# Patient Record
Sex: Female | Born: 1985 | Race: Black or African American | Hispanic: No | Marital: Married | Smoking: Former smoker
Health system: Southern US, Community
[De-identification: ages and names within clinical notes are randomized; demographics above are authoritative.]

## PROBLEM LIST (undated history)

## (undated) DIAGNOSIS — D649 Anemia, unspecified: Secondary | ICD-10-CM

## (undated) DIAGNOSIS — Z8489 Family history of other specified conditions: Secondary | ICD-10-CM

## (undated) DIAGNOSIS — R569 Unspecified convulsions: Secondary | ICD-10-CM

## (undated) DIAGNOSIS — O139 Gestational [pregnancy-induced] hypertension without significant proteinuria, unspecified trimester: Secondary | ICD-10-CM

## (undated) DIAGNOSIS — R51 Headache: Secondary | ICD-10-CM

## (undated) HISTORY — PX: IUD REMOVAL: SHX5392

## (undated) HISTORY — PX: WISDOM TOOTH EXTRACTION: SHX21

## (undated) HISTORY — PX: ORIF WRIST FRACTURE: SHX2133

## (undated) HISTORY — PX: DILATION AND CURETTAGE OF UTERUS: SHX78

## (undated) HISTORY — PX: DIAGNOSTIC LAPAROSCOPY: SUR761

---
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
## 2005-03-24 ENCOUNTER — Emergency Department: Payer: Self-pay | Admitting: Emergency Medicine

## 2005-06-03 ENCOUNTER — Other Ambulatory Visit: Payer: Self-pay

## 2005-06-04 ENCOUNTER — Observation Stay: Payer: Self-pay | Admitting: Pediatrics

## 2006-07-29 ENCOUNTER — Emergency Department: Payer: Self-pay | Admitting: Emergency Medicine

## 2007-05-12 ENCOUNTER — Inpatient Hospital Stay: Payer: Self-pay

## 2008-09-03 IMAGING — CR DG CHEST 2V
1 series · 2 of 2 positions shown · non-contrast
Comparison: none

REASON FOR EXAM: pneumonia
COMMENTS:

[Series 1: view not recorded · 0.17mm/px · 2 of 2 slices shown]
[im 1/2]
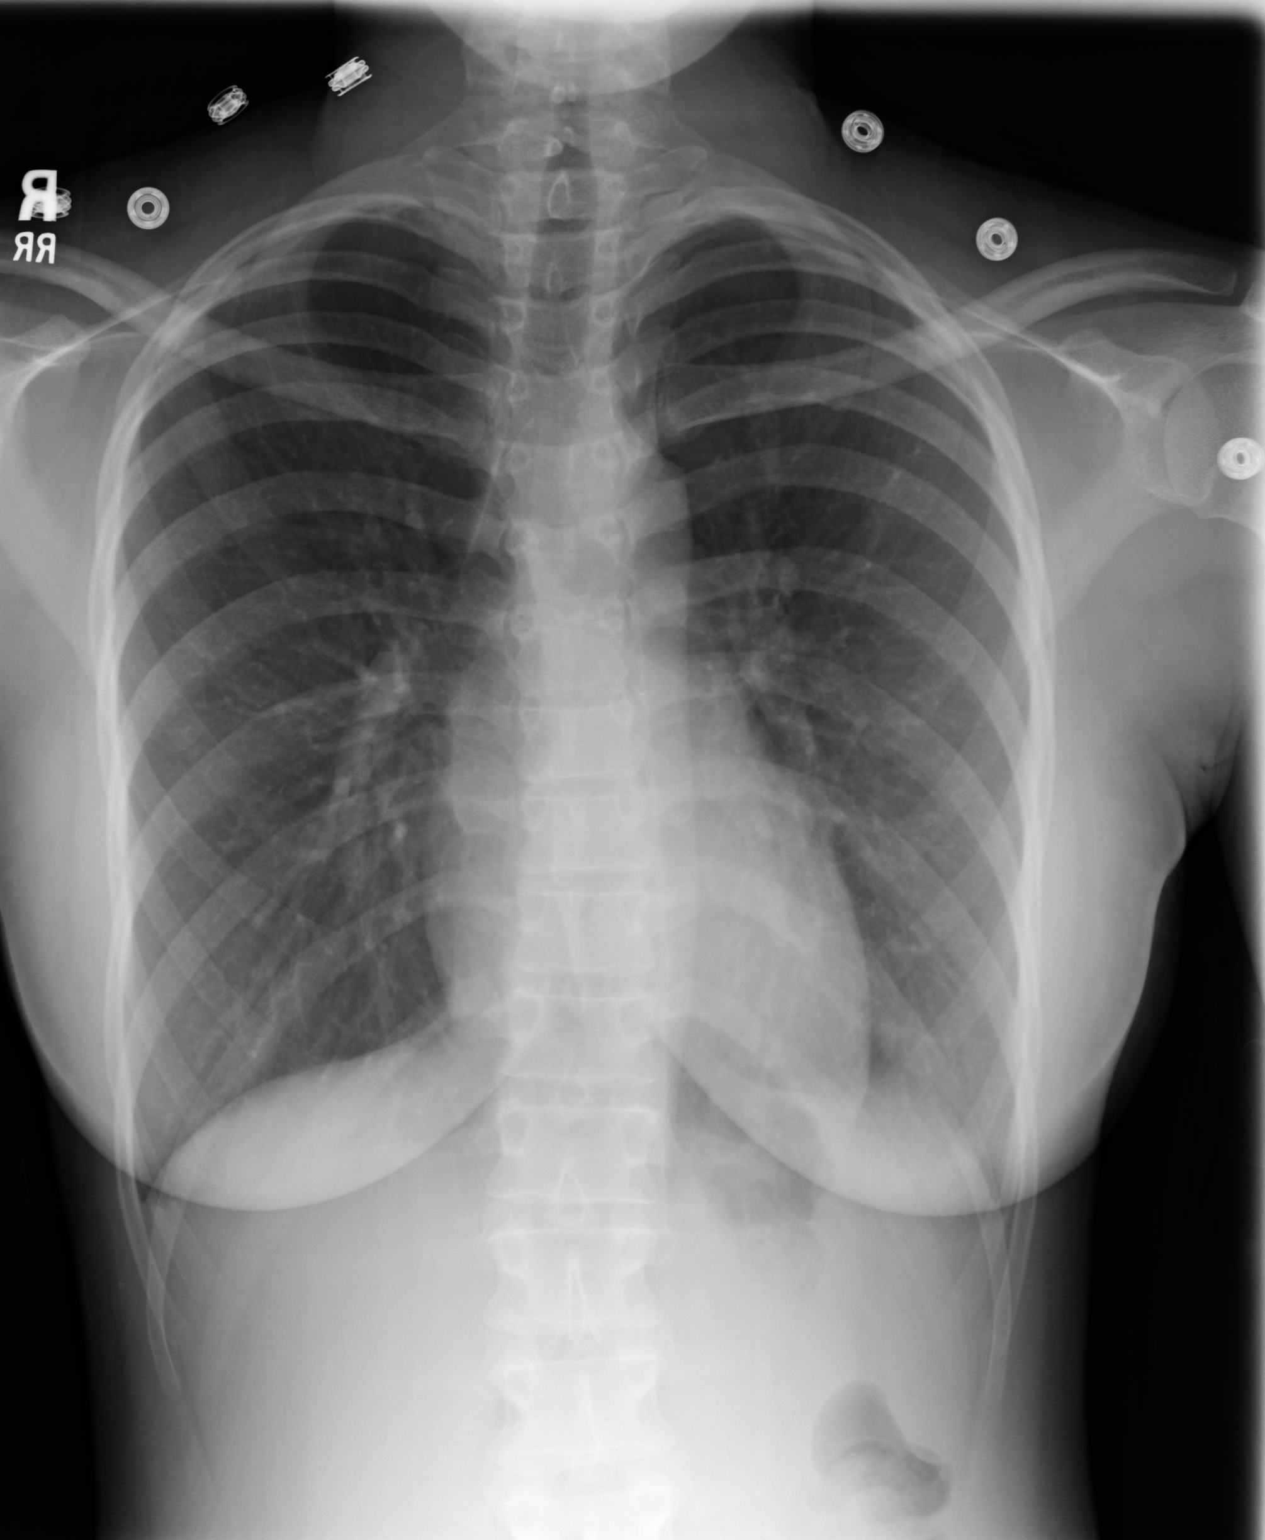
[im 2/2]
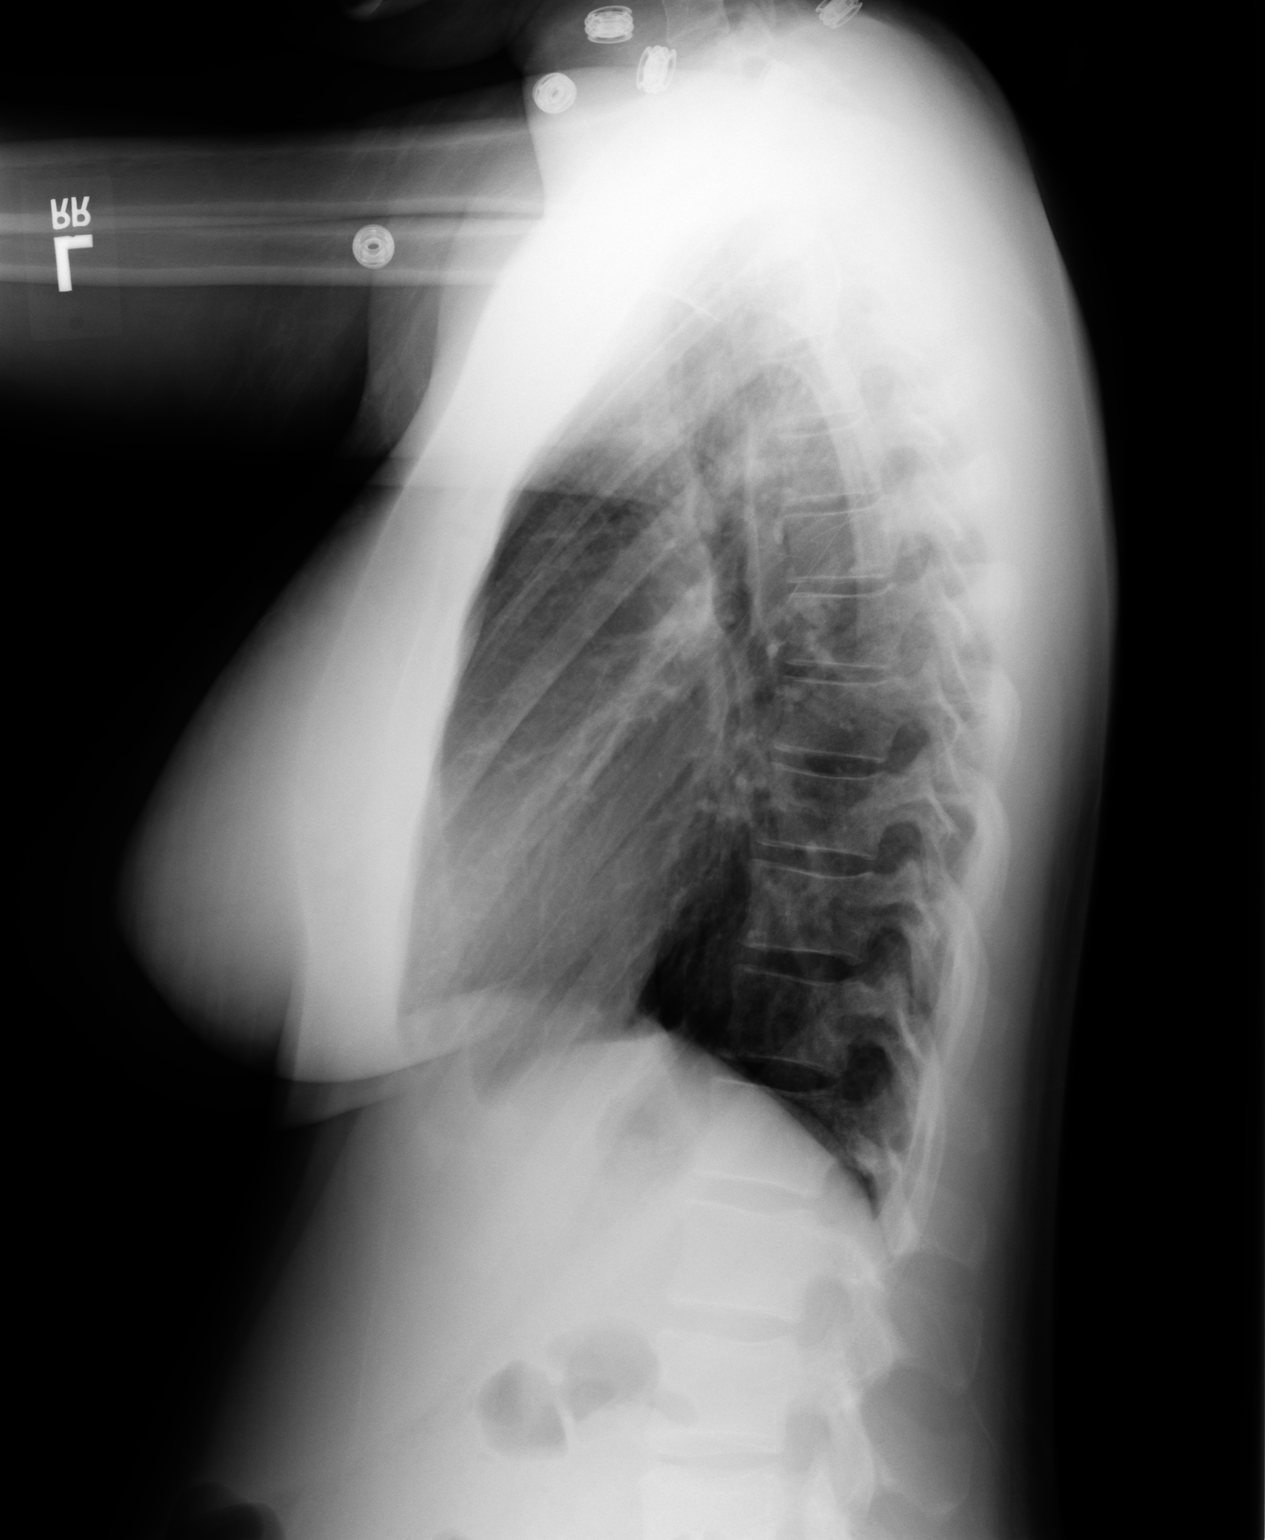

[2 of 2 positions shown; findings below may reference images not displayed]

PROCEDURE:     DXR - DXR CHEST PA (OR AP) AND LATERAL  - May 18, 2007  [DATE]

RESULT:     Comparison is made to the study dated 05/15/2007. There is
improved aeration at the lung bases. The cardiac silhouette appears to be
normal. No definite infiltrate or effusion is demonstrated. The LEFT heart
border is better visualized consistent with improving aeration in the
lingula. There is no pneumothorax. The bony structures appear intact.
IMPRESSION: 1.     Improved aeration in the lingula with no definite infiltrate evident
on today's exam.

## 2009-05-12 ENCOUNTER — Ambulatory Visit: Payer: Self-pay | Admitting: Obstetrics & Gynecology

## 2009-05-19 ENCOUNTER — Ambulatory Visit: Payer: Self-pay | Admitting: Obstetrics & Gynecology

## 2009-11-15 ENCOUNTER — Ambulatory Visit: Payer: Self-pay | Admitting: Obstetrics & Gynecology

## 2009-11-22 ENCOUNTER — Ambulatory Visit: Payer: Self-pay | Admitting: Obstetrics & Gynecology

## 2010-07-16 ENCOUNTER — Emergency Department (HOSPITAL_COMMUNITY)
Admission: EM | Admit: 2010-07-16 | Discharge: 2010-07-16 | Payer: Self-pay | Source: Home / Self Care | Admitting: Emergency Medicine

## 2010-07-17 LAB — RAPID STREP SCREEN (MED CTR MEBANE ONLY): Streptococcus, Group A Screen (Direct): NEGATIVE

## 2010-07-19 LAB — STREP A DNA PROBE: Group A Strep Probe: NEGATIVE

## 2011-02-17 ENCOUNTER — Emergency Department (HOSPITAL_COMMUNITY)
Admission: EM | Admit: 2011-02-17 | Discharge: 2011-02-17 | Disposition: A | Discharge status: Home / Self Care | Payer: Self-pay | Attending: Emergency Medicine | Admitting: Emergency Medicine

## 2011-02-17 ENCOUNTER — Emergency Department (HOSPITAL_COMMUNITY): Payer: Self-pay

## 2011-02-17 DIAGNOSIS — R05 Cough: Secondary | ICD-10-CM | POA: Insufficient documentation

## 2011-02-17 DIAGNOSIS — IMO0002 Reserved for concepts with insufficient information to code with codable children: Secondary | ICD-10-CM | POA: Insufficient documentation

## 2011-02-17 DIAGNOSIS — R059 Cough, unspecified: Secondary | ICD-10-CM | POA: Insufficient documentation

## 2011-02-17 DIAGNOSIS — R6889 Other general symptoms and signs: Secondary | ICD-10-CM | POA: Insufficient documentation

## 2011-08-17 ENCOUNTER — Encounter (HOSPITAL_COMMUNITY): Payer: Self-pay | Admitting: *Deleted

## 2011-08-17 ENCOUNTER — Emergency Department (HOSPITAL_COMMUNITY): Payer: No Typology Code available for payment source

## 2011-08-17 ENCOUNTER — Emergency Department (HOSPITAL_COMMUNITY)
Admission: EM | Admit: 2011-08-17 | Discharge: 2011-08-17 | Disposition: A | Discharge status: Home / Self Care | Payer: No Typology Code available for payment source | Attending: Emergency Medicine | Admitting: Emergency Medicine

## 2011-08-17 DIAGNOSIS — M545 Low back pain, unspecified: Secondary | ICD-10-CM | POA: Insufficient documentation

## 2011-08-17 DIAGNOSIS — S139XXA Sprain of joints and ligaments of unspecified parts of neck, initial encounter: Secondary | ICD-10-CM | POA: Insufficient documentation

## 2011-08-17 DIAGNOSIS — S335XXA Sprain of ligaments of lumbar spine, initial encounter: Secondary | ICD-10-CM | POA: Insufficient documentation

## 2011-08-17 DIAGNOSIS — M542 Cervicalgia: Secondary | ICD-10-CM | POA: Insufficient documentation

## 2011-08-17 DIAGNOSIS — S39012A Strain of muscle, fascia and tendon of lower back, initial encounter: Secondary | ICD-10-CM

## 2011-08-17 DIAGNOSIS — R1033 Periumbilical pain: Secondary | ICD-10-CM | POA: Insufficient documentation

## 2011-08-17 DIAGNOSIS — S161XXA Strain of muscle, fascia and tendon at neck level, initial encounter: Secondary | ICD-10-CM

## 2011-08-17 DIAGNOSIS — R209 Unspecified disturbances of skin sensation: Secondary | ICD-10-CM | POA: Insufficient documentation

## 2011-08-17 MED ORDER — CYCLOBENZAPRINE HCL 5 MG PO TABS: 5.0000 mg | ORAL_TABLET | Freq: Three times a day (TID) | ORAL | Status: AC | PRN

## 2011-08-17 MED ORDER — IBUPROFEN 600 MG PO TABS: 600.0000 mg | ORAL_TABLET | Freq: Four times a day (QID) | ORAL | Status: AC | PRN

## 2011-08-17 MED ORDER — OXYCODONE-ACETAMINOPHEN 5-325 MG PO TABS
1.0000 | ORAL_TABLET | Freq: Once | ORAL | Status: AC
  Administered 2011-08-17: 1 via ORAL
  Filled 2011-08-17: qty 1

## 2011-08-17 MED ORDER — HYDROCODONE-ACETAMINOPHEN 5-500 MG PO TABS: 1.0000 | ORAL_TABLET | Freq: Three times a day (TID) | ORAL | Status: AC | PRN

## 2011-08-17 NOTE — ED Notes (Signed)
Family at bedside.  Pt resting.  States she has neck and low back pain still, not as bad and that the tingling to her legs aren't as bad.

## 2011-08-17 NOTE — Discharge Instructions (Signed)
Back Exercises Back exercises help treat and prevent back injuries. The goal is to increase your strength in your belly (abdominal) and back muscles. These exercises can also help with flexibility. Start these exercises when told by your doctor. HOME CARE Back exercises include: Pelvic Tilt.  Lie on your back with your knees bent. Tilt your pelvis until the lower part of your back is against the floor. Hold this position 5 to 10 sec. Repeat this exercise 5 to 10 times.  Knee to Chest.  Pull 1 knee up against your chest and hold for 20 to 30 seconds. Repeat this with the other knee. This may be done with the other leg straight or bent, whichever feels better. Then, pull both knees up against your chest.  Sit-Ups or Curl-Ups.  Bend your knees 90 degrees. Start with tilting your pelvis, and do a partial, slow sit-up. Only lift your upper half 30 to 45 degrees off the floor. Take at least 2 to 3 seonds for each sit-up. Do not do sit-ups with your knees out straight. If partial sit-ups are difficult, simply do the above but with only tightening your belly (abdominal) muscles and holding it as told.  Hip-Lift.  Lie on your back with your knees flexed 90 degrees. Push down with your feet and shoulders as you raise your hips 2 inches off the floor. Hold for 10 seconds, repeat 5 to 10 times.  Back Arches.  Lie on your stomach. Prop yourself up on bent elbows. Slowly press on your hands, causing an arch in your low back. Repeat 3 to 5 times.  Shoulder-Lifts.  Lie face down with arms beside your body. Keep hips and belly pressed to floor as you slowly lift your head and shoulders off the floor.  Do not overdo your exercises. Be careful in the beginning. Exercises may cause you some mild back discomfort. If the pain lasts for more than 15 minutes, stop the exercises until you see your doctor. Improvement with exercise for back problems is slow.  Document Released: 07/21/2010 Document Revised: 02/28/2011  Document Reviewed: 07/21/2010 [facility] Patient Information 2012 [place], [place]. Your x-rays are normal.  Please take the medication as prescribed.  Follow up with Dr. Philipp Deputy, orthopedics if you still having pain after 7-10 days
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
## 2011-08-18 NOTE — ED Provider Notes (Signed)
Medical screening examination/treatment/procedure(s) were performed by non-physician practitioner and as supervising physician I was immediately available for consultation/collaboration.   Celene Kras, MD 08/18/11 [phone]

## 2012-01-23 ENCOUNTER — Emergency Department (HOSPITAL_COMMUNITY): Payer: No Typology Code available for payment source

## 2012-01-23 ENCOUNTER — Encounter (HOSPITAL_COMMUNITY): Payer: Self-pay | Admitting: Emergency Medicine

## 2012-01-23 ENCOUNTER — Emergency Department (HOSPITAL_COMMUNITY)
Admission: EM | Admit: 2012-01-23 | Discharge: 2012-01-23 | Disposition: A | Discharge status: Home / Self Care | Payer: No Typology Code available for payment source | Attending: Emergency Medicine | Admitting: Emergency Medicine

## 2012-01-23 DIAGNOSIS — R0602 Shortness of breath: Secondary | ICD-10-CM | POA: Insufficient documentation

## 2012-01-23 DIAGNOSIS — Y92410 Unspecified street and highway as the place of occurrence of the external cause: Secondary | ICD-10-CM | POA: Insufficient documentation

## 2012-01-23 DIAGNOSIS — M542 Cervicalgia: Secondary | ICD-10-CM | POA: Insufficient documentation

## 2012-01-23 DIAGNOSIS — S62101A Fracture of unspecified carpal bone, right wrist, initial encounter for closed fracture: Secondary | ICD-10-CM

## 2012-01-23 DIAGNOSIS — S62109A Fracture of unspecified carpal bone, unspecified wrist, initial encounter for closed fracture: Secondary | ICD-10-CM | POA: Insufficient documentation

## 2012-01-23 MED ORDER — ONDANSETRON HCL 4 MG/2ML IJ SOLN
4.0000 mg | Freq: Once | INTRAMUSCULAR | Status: AC
  Administered 2012-01-23: 4 mg via INTRAVENOUS
  Filled 2012-01-23: qty 2

## 2012-01-23 MED ORDER — OXYCODONE-ACETAMINOPHEN 5-325 MG PO TABS
1.0000 | ORAL_TABLET | Freq: Once | ORAL | Status: AC
  Administered 2012-01-23: 1 via ORAL
  Filled 2012-01-23: qty 1

## 2012-01-23 MED ORDER — OXYCODONE-ACETAMINOPHEN 5-325 MG PO TABS: 1.0000 | ORAL_TABLET | ORAL | Status: AC | PRN

## 2012-01-23 MED ORDER — MORPHINE SULFATE 4 MG/ML IJ SOLN: 4.0000 mg | Freq: Once | INTRAMUSCULAR | Status: DC

## 2012-01-23 MED ORDER — MORPHINE SULFATE 4 MG/ML IJ SOLN
4.0000 mg | Freq: Once | INTRAMUSCULAR | Status: AC
  Administered 2012-01-23: 4 mg via INTRAVENOUS
  Filled 2012-01-23: qty 1

## 2012-01-23 MED ORDER — DIPHENHYDRAMINE HCL 50 MG/ML IJ SOLN: 25.0000 mg | Freq: Once | INTRAMUSCULAR | Status: DC

## 2012-01-23 NOTE — Progress Notes (Signed)
Orthopedic Tech Progress Note Patient Details:  Taylor Hood 1985/12/16 [id number]  Ortho Devices Type of Ortho Device: Arm foam sling;Sugartong splint Ortho Device/Splint Location: (R) UE Ortho Device/Splint Interventions: Application   Jennye Moccasin 01/23/2012, 6:52 PM
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
## 2012-01-25 ENCOUNTER — Encounter (HOSPITAL_COMMUNITY): Payer: Self-pay

## 2012-01-25 ENCOUNTER — Other Ambulatory Visit: Payer: Self-pay | Admitting: Orthopedic Surgery

## 2012-01-29 ENCOUNTER — Encounter (HOSPITAL_COMMUNITY)
Admission: RE | Admit: 2012-01-29 | Discharge: 2012-01-29 | Disposition: A | Discharge status: Home / Self Care | Payer: No Typology Code available for payment source | Source: Ambulatory Visit | Attending: Orthopedic Surgery | Admitting: Orthopedic Surgery

## 2012-01-29 ENCOUNTER — Encounter (HOSPITAL_COMMUNITY): Payer: Self-pay

## 2012-01-29 HISTORY — DX: Unspecified convulsions: R56.9

## 2012-01-29 HISTORY — DX: Family history of other specified conditions: Z84.89

## 2012-01-29 LAB — CBC
HCT: 34.5 % — ABNORMAL LOW (ref 36.0–46.0)
Hemoglobin: 12.1 g/dL (ref 12.0–15.0)
MCH: 31.3 pg (ref 26.0–34.0)
MCHC: 35.1 g/dL (ref 30.0–36.0)
MCV: 89.4 fL (ref 78.0–100.0)
Platelets: 243 10*3/uL (ref 150–400)
RBC: 3.86 MIL/uL — ABNORMAL LOW (ref 3.87–5.11)
RDW: 12.6 % (ref 11.5–15.5)
WBC: 6.3 10*3/uL (ref 4.0–10.5)

## 2012-01-29 LAB — SURGICAL PCR SCREEN
MRSA, PCR: NEGATIVE
Staphylococcus aureus: NEGATIVE

## 2012-01-29 LAB — HCG, SERUM, QUALITATIVE: Preg, Serum: NEGATIVE

## 2012-01-29 MED ORDER — CHLORHEXIDINE GLUCONATE 4 % EX LIQD: 60.0000 mL | Freq: Once | CUTANEOUS | Status: DC

## 2012-01-29 MED ORDER — CEFAZOLIN SODIUM-DEXTROSE 2-3 GM-% IV SOLR
2.0000 g | INTRAVENOUS | Status: DC
  Filled 2012-01-29: qty 50

## 2012-01-29 NOTE — Pre-Procedure Instructions (Signed)
[address]  01/29/2012   Your procedure is scheduled on:  Wednesday January 30, 2012  Report to [facility] at 12:30PM.  Call this number if you have problems the morning of surgery: [phone]   Remember:   Do not eat food or drink :After Midnight.    Take these medicines the morning of surgery with A SIP OF WATER: percocet   Do not wear jewelry, make-up or nail polish.  Do not wear lotions, powders, or perfumes. You may wear deodorant.  Do not shave 48 hours prior to surgery. Men may shave face and neck.  Do not bring valuables to the hospital.  Contacts, dentures or bridgework may not be worn into surgery.  Leave suitcase in the car. After surgery it may be brought to your room.  For patients admitted to the hospital, checkout time is 11:00 AM the day of discharge.   Patients discharged the day of surgery will not be allowed to drive home.  Name and phone number of your driver: family / friend  Special Instructions: Incentive Spirometry - Practice and bring it with you on the day of surgery. and CHG Shower Use Special Wash: 1/2 bottle night before surgery and 1/2 bottle morning of surgery.   Please read over the following fact sheets that you were given: Pain Booklet, Coughing and Deep Breathing, MRSA Information and Surgical Site Infection Prevention

## 2012-01-30 ENCOUNTER — Ambulatory Visit (HOSPITAL_COMMUNITY): Payer: No Typology Code available for payment source | Admitting: Anesthesiology

## 2012-01-30 ENCOUNTER — Encounter (HOSPITAL_COMMUNITY): Payer: Self-pay | Admitting: *Deleted

## 2012-01-30 ENCOUNTER — Encounter (HOSPITAL_COMMUNITY): Payer: Self-pay | Admitting: Anesthesiology

## 2012-01-30 ENCOUNTER — Ambulatory Visit (HOSPITAL_COMMUNITY)
Admission: RE | Admit: 2012-01-30 | Discharge: 2012-01-30 | Disposition: A | Discharge status: Home / Self Care | Payer: No Typology Code available for payment source | Source: Ambulatory Visit | Attending: Orthopedic Surgery | Admitting: Orthopedic Surgery

## 2012-01-30 ENCOUNTER — Encounter (HOSPITAL_COMMUNITY)
Admission: RE | Disposition: A | Discharge status: Home / Self Care | Payer: Self-pay | Source: Ambulatory Visit | Attending: Orthopedic Surgery

## 2012-01-30 DIAGNOSIS — S5290XA Unspecified fracture of unspecified forearm, initial encounter for closed fracture: Secondary | ICD-10-CM

## 2012-01-30 DIAGNOSIS — S52509A Unspecified fracture of the lower end of unspecified radius, initial encounter for closed fracture: Secondary | ICD-10-CM | POA: Insufficient documentation

## 2012-01-30 DIAGNOSIS — Z01812 Encounter for preprocedural laboratory examination: Secondary | ICD-10-CM | POA: Insufficient documentation

## 2012-01-30 DIAGNOSIS — X58XXXA Exposure to other specified factors, initial encounter: Secondary | ICD-10-CM | POA: Insufficient documentation

## 2012-01-30 HISTORY — PX: CARPAL TUNNEL RELEASE: SHX101

## 2012-01-30 SURGERY — OPEN REDUCTION INTERNAL FIXATION (ORIF) DISTAL RADIUS FRACTURE
Anesthesia: General | Site: Arm Lower | Laterality: Right | Wound class: Clean

## 2012-01-30 MED ORDER — ROPIVACAINE HCL 5 MG/ML IJ SOLN
INTRAMUSCULAR | Status: DC | PRN
  Administered 2012-01-30: 30 mL via EPIDURAL

## 2012-01-30 MED ORDER — BUPIVACAINE HCL (PF) 0.25 % IJ SOLN
INTRAMUSCULAR | Status: DC | PRN
  Administered 2012-01-30: 8 mL

## 2012-01-30 MED ORDER — FENTANYL CITRATE 0.05 MG/ML IJ SOLN
INTRAMUSCULAR | Status: DC | PRN
  Administered 2012-01-30 (×2): 100 ug via INTRAVENOUS

## 2012-01-30 MED ORDER — PROPOFOL 10 MG/ML IV EMUL
INTRAVENOUS | Status: DC | PRN
  Administered 2012-01-30: 120 mg via INTRAVENOUS

## 2012-01-30 MED ORDER — ONDANSETRON HCL 4 MG/2ML IJ SOLN: 4.0000 mg | Freq: Once | INTRAMUSCULAR | Status: DC | PRN

## 2012-01-30 MED ORDER — NEOSTIGMINE METHYLSULFATE 1 MG/ML IJ SOLN
INTRAMUSCULAR | Status: DC | PRN
  Administered 2012-01-30: 4 mg via INTRAVENOUS
  Administered 2012-01-30: 1 mg via INTRAVENOUS

## 2012-01-30 MED ORDER — MIDAZOLAM HCL 5 MG/5ML IJ SOLN
INTRAMUSCULAR | Status: DC | PRN
  Administered 2012-01-30: 2 mg via INTRAVENOUS

## 2012-01-30 MED ORDER — ROCURONIUM BROMIDE 100 MG/10ML IV SOLN
INTRAVENOUS | Status: DC | PRN
  Administered 2012-01-30: 50 mg via INTRAVENOUS

## 2012-01-30 MED ORDER — HYDROMORPHONE HCL PF 1 MG/ML IJ SOLN: 0.2500 mg | INTRAMUSCULAR | Status: DC | PRN

## 2012-01-30 MED ORDER — CEFAZOLIN SODIUM-DEXTROSE 2-3 GM-% IV SOLR
INTRAVENOUS | Status: AC
  Filled 2012-01-30: qty 50

## 2012-01-30 MED ORDER — HYDROMORPHONE HCL 2 MG PO TABS: 2.0000 mg | ORAL_TABLET | ORAL | Status: AC | PRN

## 2012-01-30 MED ORDER — BUPIVACAINE HCL (PF) 0.25 % IJ SOLN
INTRAMUSCULAR | Status: AC
  Filled 2012-01-30: qty 30

## 2012-01-30 MED ORDER — LACTATED RINGERS IV SOLN
INTRAVENOUS | Status: DC | PRN
  Administered 2012-01-30 (×2): via INTRAVENOUS

## 2012-01-30 MED ORDER — LIDOCAINE HCL (CARDIAC) 20 MG/ML IV SOLN
INTRAVENOUS | Status: DC | PRN
  Administered 2012-01-30: 100 mg via INTRAVENOUS

## 2012-01-30 MED ORDER — GLYCOPYRROLATE 0.2 MG/ML IJ SOLN
INTRAMUSCULAR | Status: DC | PRN
  Administered 2012-01-30: 0.6 mg via INTRAVENOUS
  Administered 2012-01-30: 0.2 mg via INTRAVENOUS

## 2012-01-30 MED ORDER — LACTATED RINGERS IV SOLN
INTRAVENOUS | Status: DC
  Administered 2012-01-30: 14:00:00 via INTRAVENOUS

## 2012-01-30 MED ORDER — DEXAMETHASONE SODIUM PHOSPHATE 4 MG/ML IJ SOLN
INTRAMUSCULAR | Status: DC | PRN
  Administered 2012-01-30: 4 mg via INTRAVENOUS

## 2012-01-30 MED ORDER — ONDANSETRON HCL 4 MG/2ML IJ SOLN
INTRAMUSCULAR | Status: DC | PRN
  Administered 2012-01-30: 4 mg via INTRAVENOUS

## 2012-01-30 MED ORDER — CEFAZOLIN SODIUM 1-5 GM-% IV SOLN
INTRAVENOUS | Status: DC | PRN
  Administered 2012-01-30: 2 g via INTRAVENOUS

## 2012-01-30 SURGICAL SUPPLY — 46 items
BANDAGE ELASTIC 3 VELCRO ST LF (GAUZE/BANDAGES/DRESSINGS) ×2 IMPLANT
BANDAGE GAUZE ELAST BULKY 4 IN (GAUZE/BANDAGES/DRESSINGS) ×2 IMPLANT
BIT DRILL 2 FAST STEP (BIT) ×2 IMPLANT
BIT DRILL 2.5X4 QC (BIT) ×2 IMPLANT
BNDG CMPR 9X4 STRL LF SNTH (GAUZE/BANDAGES/DRESSINGS) ×1
BNDG ESMARK 4X9 LF (GAUZE/BANDAGES/DRESSINGS) ×2 IMPLANT
CLOTH BEACON ORANGE TIMEOUT ST (SAFETY) ×2 IMPLANT
CORDS BIPOLAR (ELECTRODE) ×2 IMPLANT
COVER MAYO STAND STRL (DRAPES) IMPLANT
COVER SURGICAL LIGHT HANDLE (MISCELLANEOUS) ×2 IMPLANT
CUFF TOURNIQUET SINGLE 18IN (TOURNIQUET CUFF) ×2 IMPLANT
CUFF TOURNIQUET SINGLE 24IN (TOURNIQUET CUFF) IMPLANT
DRAPE C-ARM MINI 42X72 WSTRAPS (DRAPES) ×2 IMPLANT
DRAPE SURG 17X23 STRL (DRAPES) ×2 IMPLANT
DURAPREP 26ML APPLICATOR (WOUND CARE) ×2 IMPLANT
GAUZE XEROFORM 1X8 LF (GAUZE/BANDAGES/DRESSINGS) ×2 IMPLANT
GLOVE BIO SURGEON STRL SZ8.5 (GLOVE) ×2 IMPLANT
GOWN PREVENTION PLUS XXLARGE (GOWN DISPOSABLE) ×2 IMPLANT
GOWN SRG XL XLNG 56XLVL 4 (GOWN DISPOSABLE) ×1 IMPLANT
GOWN STRL NON-REIN LRG LVL3 (GOWN DISPOSABLE) ×2 IMPLANT
GOWN STRL NON-REIN XL XLG LVL4 (GOWN DISPOSABLE) ×2
KIT BASIN OR (CUSTOM PROCEDURE TRAY) ×2 IMPLANT
KIT ROOM TURNOVER OR (KITS) ×2 IMPLANT
NEEDLE HYPO 25GX1X1/2 BEV (NEEDLE) IMPLANT
NS IRRIG 1000ML POUR BTL (IV SOLUTION) ×2 IMPLANT
PACK ORTHO EXTREMITY (CUSTOM PROCEDURE TRAY) ×2 IMPLANT
PAD ARMBOARD 7.5X6 YLW CONV (MISCELLANEOUS) ×4 IMPLANT
PAD CAST 4YDX4 CTTN HI CHSV (CAST SUPPLIES) ×1 IMPLANT
PADDING CAST COTTON 4X4 STRL (CAST SUPPLIES) ×2
PEG FULLY THREADED 2.5X22MM (Peg) ×12 IMPLANT
PLATE SHORT 24.4X51.3 RT (Plate) ×2 IMPLANT
SCREW PEG LOCK 2.5X24 (Peg) ×2 IMPLANT
SPONGE GAUZE 4X4 12PLY (GAUZE/BANDAGES/DRESSINGS) ×2 IMPLANT
STRIP CLOSURE SKIN 1/2X4 (GAUZE/BANDAGES/DRESSINGS) ×2 IMPLANT
SUT ETHILON 4 0 PS 2 18 (SUTURE) IMPLANT
SUT PROLENE 3 0 PS 2 (SUTURE) IMPLANT
SUT VIC AB 2-0 SH 27 (SUTURE) ×2
SUT VIC AB 2-0 SH 27XBRD (SUTURE) ×1 IMPLANT
SUT VIC AB 3-0 PS2 18 (SUTURE)
SUT VIC AB 3-0 PS2 18XBRD (SUTURE) IMPLANT
SUT VICRYL RAPIDE 4/0 PS 2 (SUTURE) ×2 IMPLANT
SYR CONTROL 10ML LL (SYRINGE) IMPLANT
TOWEL OR 17X24 6PK STRL BLUE (TOWEL DISPOSABLE) ×2 IMPLANT
TOWEL OR 17X26 10 PK STRL BLUE (TOWEL DISPOSABLE) ×2 IMPLANT
UNDERPAD 30X30 INCONTINENT (UNDERPADS AND DIAPERS) ×2 IMPLANT
WATER STERILE IRR 1000ML POUR (IV SOLUTION) ×2 IMPLANT

## 2012-01-30 NOTE — Discharge Instructions (Signed)
Cast or Splint Care Casts and splints support injured limbs and keep bones from moving while they heal.  HOME CARE  Keep the cast or splint uncovered during the drying period.   A plaster cast can take 24 to 48 hours to dry.   A fiberglass cast will dry in less than 1 hour.   Do not rest the cast on anything harder than a pillow for 24 hours.   Do not put weight on your injured limb. Do not put pressure on the cast. Wait for your doctor's approval.   Keep the cast or splint dry.   Cover the cast or splint with a plastic bag during baths or wet weather.   If you have a cast over your chest and belly (trunk), take sponge baths until the cast is taken off.   Keep your cast or splint clean. Wash a dirty cast with a damp cloth.   Do not put any objects under your cast or splint. Do not scratch the skin under the cast with an object.   Do not take out the padding from inside your cast.   Exercise your joints near the cast as told by your doctor.   Raise (elevate) your injured limb on 1 or 2 pillows for the first 1 to 3 days.  GET HELP RIGHT AWAY IF:  Your cast or splint cracks.   Your cast or splint is too tight or too loose.   You itch badly under the cast.   Your cast gets wet or has a soft spot.   You have a bad smell coming from the cast.   You get an object stuck under the cast.   Your skin around the cast becomes red or raw.   You have new or more pain after the cast is put on.   You have fluid leaking through the cast.   You cannot move your fingers or toes.   Your fingers or toes turn colors or are cool, painful, or puffy (swollen).   You have tingling or lose feeling (numbness) around the injured area.   You have pain or pressure under the cast.   You have trouble breathing or have shortness of breath.   You have chest pain.  MAKE SURE YOU:  Understand these instructions.   Will watch your condition.   Will get help right away if you are not doing  well or get worse.  Document Released: 10/18/2010 Document Revised: 06/07/2011 Document Reviewed: 10/18/2010 [employer] Patient Information 2012 [employer]. 

## 2012-01-30 NOTE — Brief Op Note (Signed)
01/30/2012  4:30 PM  PATIENT:  Taylor Hood  26 y.o. female  PRE-OPERATIVE DIAGNOSIS:  right distal radius fx right distal ulnar fx  POST-OPERATIVE DIAGNOSIS:  right distal radius fracture, right distal ulnar fracture  PROCEDURE:  Procedure(s) (LRB): OPEN REDUCTION INTERNAL FIXATION (ORIF) DISTAL RADIAL FRACTURE (Right) CARPAL TUNNEL RELEASE (Right)  SURGEON:  Surgeon(s) and Role:    * Marlowe Shores, MD - Primary  PHYSICIAN ASSISTANT:   ASSISTANTS: none   ANESTHESIA:   general  EBL:  Total I/O In: 1000 [I.V.:1000] Out: -   BLOOD ADMINISTERED:none  DRAINS: none   LOCAL MEDICATIONS USED:  MARCAINE   10cc  SPECIMEN:  No Specimen  DISPOSITION OF SPECIMEN:  N/A  COUNTS:  YES  TOURNIQUET:   Total Tourniquet Time Documented: Upper Arm (Right) - 39 minutes  DICTATION: .Other Dictation: Dictation Number [id number]  PLAN OF CARE: Discharge to home after PACU  PATIENT DISPOSITION:  PACU - hemodynamically stable.   Delay start of Pharmacological VTE agent (>24hrs) due to surgical blood loss or risk of bleeding: not applicable

## 2012-01-30 NOTE — Anesthesia Postprocedure Evaluation (Signed)
Anesthesia Post Note  Patient: Taylor Hood  Procedure(s) Performed: Procedure(s) (LRB): OPEN REDUCTION INTERNAL FIXATION (ORIF) DISTAL RADIAL FRACTURE (Right) CARPAL TUNNEL RELEASE (Right)  Anesthesia type: general  Patient location: PACU  Post pain: Pain level controlled  Post assessment: Patient's Cardiovascular Status Stable  Last Vitals:  Filed Vitals:   01/30/12 1700  BP: 127/79  Pulse: 67  Temp: 35.7 C  Resp: 14    Post vital signs: Reviewed and stable  Level of consciousness: sedated  Complications: No apparent anesthesia complications

## 2012-01-30 NOTE — Preoperative (Signed)
Beta Blockers   Reason not to administer Beta Blockers:Not Applicable 

## 2012-01-30 NOTE — Progress Notes (Signed)
Orthopedic Tech Progress Note Patient Details:  Taylor Hood 1985/08/22 [id number]  Ortho Devices Type of Ortho Device: Arm foam sling Ortho Device/Splint Location: (R) UE Ortho Device/Splint Interventions: Application   Jennye Moccasin 01/30/2012, 6:36 PM

## 2012-01-30 NOTE — Transfer of Care (Signed)
Immediate Anesthesia Transfer of Care Note  Patient: Taylor Hood  Procedure(s) Performed: Procedure(s) (LRB): OPEN REDUCTION INTERNAL FIXATION (ORIF) DISTAL RADIAL FRACTURE (Right) CARPAL TUNNEL RELEASE (Right)  Patient Location: PACU  Anesthesia Type: GA combined with regional for post-op pain  Level of Consciousness: awake and alert   Airway & Oxygen Therapy: Patient Spontanous Breathing and Patient connected to nasal cannula oxygen  Post-op Assessment: Report given to PACU RN, Post -op Vital signs reviewed and stable and Patient moving all extremities X 4  Post vital signs: Reviewed and stable  Complications: No apparent anesthesia complications
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
## 2012-01-30 NOTE — Anesthesia Procedure Notes (Addendum)
Anesthesia Regional Block:  Supraclavicular block  Pre-Anesthetic Checklist: ,, timeout performed, Correct Patient, Correct Site, Correct Laterality, Correct Procedure, Correct Position, site marked, Risks and benefits discussed,  Surgical consent,  Pre-op evaluation,  At surgeon's request and post-op pain management  Laterality: Right  Prep: chloraprep       Needles:  Injection technique: Single-shot  Needle Type: Echogenic Stimulator Needle     Needle Length: 5cm 5 cm     Additional Needles:  Procedures: ultrasound guided and nerve stimulator Supraclavicular block  Nerve Stimulator or Paresthesia:  Response: 0.4 mA,   Additional Responses:   Narrative:  Start time: 01/30/2012 2:30 PM End time: 01/30/2012 2:50 PM Injection made incrementally with aspirations every 5 mL. Anesthesiologist: Arta Bruce MD  Additional Notes: Monitors applied. Patient sedated. Sterile prep and drape,hand hygiene and sterile gloves were used. Relevant anatomy identified.Needle position confirmed.Local anesthetic injected incrementally after negative aspiration. Local anesthetic spread visualized around nerve(s). Vascular puncture avoided. No complications. Image printed for medical record.The patient tolerated the procedure well.       Supraclavicular block Procedure Name: Intubation Date/Time: 01/30/2012 3:37 PM Performed by: Elon Alas Pre-anesthesia Checklist: Patient identified, Timeout performed, Emergency Drugs available, Suction available and Patient being monitored Patient Re-evaluated:Patient Re-evaluated prior to inductionOxygen Delivery Method: Circle system utilized Preoxygenation: Pre-oxygenation with 100% oxygen Intubation Type: IV induction Ventilation: Mask ventilation without difficulty Laryngoscope Size: Mac and 3 Grade View: Grade I Tube type: Oral Tube size: 7.0 mm Number of attempts: 1 Airway Equipment and Method: Stylet Placement Confirmation: ETT inserted  through vocal cords under direct vision,  positive ETCO2 and breath sounds checked- equal and bilateral Secured at: 21 cm Tube secured with: Tape Dental Injury: Teeth and Oropharynx as per pre-operative assessment

## 2012-01-30 NOTE — Op Note (Signed)
See dictated note [phone]

## 2012-01-30 NOTE — Anesthesia Preprocedure Evaluation (Addendum)
Anesthesia Evaluation  Patient identified by MRN, date of birth, ID band Patient awake    Reviewed: Allergy & Precautions, H&P , NPO status , Patient's Chart, lab work & pertinent test results  History of Anesthesia Complications (+) Family history of anesthesia reaction  Airway Mallampati: I TM Distance: >3 FB Neck ROM: Full    Dental   Pulmonary neg pulmonary ROS,          Cardiovascular negative cardio ROS      Neuro/Psych Seizures -,  Seizure at age 26, no known reason for occurring negative psych ROS   GI/Hepatic negative GI ROS, Neg liver ROS,   Endo/Other  negative endocrine ROS  Renal/GU negative Renal ROS     Musculoskeletal negative musculoskeletal ROS (+)   Abdominal   Peds  Hematology negative hematology ROS (+)   Anesthesia Other Findings   Reproductive/Obstetrics negative OB ROS                         Anesthesia Physical Anesthesia Plan  ASA: II  Anesthesia Plan: General   Post-op Pain Management:    Induction: Intravenous  Airway Management Planned: Oral ETT  Additional Equipment:   Intra-op Plan:   Post-operative Plan: Extubation in OR  Informed Consent: I have reviewed the patients History and Physical, chart, labs and discussed the procedure including the risks, benefits and alternatives for the proposed anesthesia with the patient or authorized representative who has indicated his/her understanding and acceptance.   Dental advisory given  Plan Discussed with: Surgeon and CRNA  Anesthesia Plan Comments:        Anesthesia Quick Evaluation

## 2012-01-31 ENCOUNTER — Encounter (HOSPITAL_COMMUNITY): Payer: Self-pay | Admitting: Orthopedic Surgery

## 2012-01-31 NOTE — Op Note (Signed)
Taylor Hood, Taylor Hood             ACCOUNT NO.:  [id number]  MEDICAL RECORD NO.:  [id number]  LOCATION:  [facility]                         FACILITY:  [facility]  PHYSICIAN:  Artist Pais. Leannah Guse, M.D.DATE OF BIRTH:  August 31, 1985  DATE OF PROCEDURE:  01/30/2012 DATE OF DISCHARGE:  01/30/2012                              OPERATIVE REPORT   PREOPERATIVE DIAGNOSES:  Displaced intra-articular fracture, right distal radius and right ulnar styloid fracture.  POSTOPERATIVE DIAGNOSES:  Displaced intra-articular fracture, right distal radius and right ulnar styloid fracture.  PROCEDURE:  Open reduction and internal fixation as above with DVR plate and screws, carpal tunnel release, closed treatment of ulnar styloid fracture.  SURGEON:  Artist Pais. Mina Marble, M.D.  ASSISTANT:  None.  ANESTHESIA:  Ax block and general.  COMPLICATIONS:  No complications.  DESCRIPTION OF PROCEDURE:  The patient was taken to the operating suite. After induction of adequate IV analgesia and axillary block analgesia, the right upper extremity was prepped and draped in sterile fashion. Esmarch was used to exsanguinate the limb.  Tourniquet was then inflated to 250 mmHg.  At this point in time, a standard volar approach to the distal radius was undertaken.  Skin was incised sharply over the palpable border of the flexor carpi radialis tendon.  Skin was incised 6- 8 cm.  The FCR sheath was incised.  The FCR was tracked in the midline, radial artery in the lateral side.  This was developed down the level of pronator quadratus.  Carefully subperiosteally stripped off the distal radius revealing intra-articular fracture of 3-4 part.  The subperiosteal section including release of the brachioradialis off the styloid fragment.  After this was completed, a reduction was performed using longitudinal traction, flexion ulnar deviation.  This was done with several times until adequate reduction was obtained.  After this was  completed, a DVR plate was fastened to the volar aspect of the distal radius.  DVR anatomic short plate was placed in the slotted hole. Fluoroscopy was then used to determine adequate position.  After this was completed, the remaining cortical screw was placed proximally followed by smooth pegs distally.  Intraoperative fluoroscopy revealed adequate reduction in AP, lateral, oblique view.  There was a displaced ulnar styloid fragment that reduced quite well without fixation, was then treated nonoperatively.  The median nerve was identified the proximal aspect of the incision traced in the carpal canal.  Path was created dorsal, volar, and transverse carpal ligament.  This was divided from distal to proximal under direct vision.  Decompressing the nerve wound was irrigated and loosely closed in layers of 2-0 undyed Vicryl and a 4-0 Rapide subcuticular stitch on the skin.  Steri-Strips, 4x4s, fluffs, and a volar splint was applied.  The patient tolerated the procedure well and went to recovery room in a stable fashion.     Artist Pais Mina Marble, M.D.     MAW/MEDQ  D:  01/30/2012  T:  01/31/2012  Job:  [id number]

## 2012-02-13 ENCOUNTER — Ambulatory Visit: Payer: Self-pay | Attending: Orthopedic Surgery | Admitting: Occupational Therapy

## 2012-02-13 DIAGNOSIS — M25539 Pain in unspecified wrist: Secondary | ICD-10-CM | POA: Insufficient documentation

## 2012-02-13 DIAGNOSIS — IMO0001 Reserved for inherently not codable concepts without codable children: Secondary | ICD-10-CM | POA: Insufficient documentation

## 2012-02-15 ENCOUNTER — Ambulatory Visit: Payer: Self-pay | Admitting: *Deleted

## 2012-02-21 ENCOUNTER — Ambulatory Visit: Payer: Self-pay | Admitting: Occupational Therapy

## 2012-07-09 ENCOUNTER — Encounter (HOSPITAL_BASED_OUTPATIENT_CLINIC_OR_DEPARTMENT_OTHER): Payer: Self-pay | Admitting: *Deleted

## 2012-07-14 ENCOUNTER — Other Ambulatory Visit: Payer: Self-pay | Admitting: Orthopedic Surgery

## 2012-07-16 ENCOUNTER — Ambulatory Visit (HOSPITAL_BASED_OUTPATIENT_CLINIC_OR_DEPARTMENT_OTHER)
Admission: RE | Admit: 2012-07-16 | Discharge: 2012-07-16 | Disposition: A | Discharge status: Home / Self Care | Payer: No Typology Code available for payment source | Source: Ambulatory Visit | Attending: Orthopedic Surgery | Admitting: Orthopedic Surgery

## 2012-07-16 ENCOUNTER — Encounter (HOSPITAL_BASED_OUTPATIENT_CLINIC_OR_DEPARTMENT_OTHER): Payer: Self-pay | Admitting: Anesthesiology

## 2012-07-16 ENCOUNTER — Encounter (HOSPITAL_BASED_OUTPATIENT_CLINIC_OR_DEPARTMENT_OTHER)
Admission: RE | Disposition: A | Discharge status: Home / Self Care | Payer: Self-pay | Source: Ambulatory Visit | Attending: Orthopedic Surgery

## 2012-07-16 ENCOUNTER — Encounter (HOSPITAL_BASED_OUTPATIENT_CLINIC_OR_DEPARTMENT_OTHER): Payer: Self-pay | Admitting: *Deleted

## 2012-07-16 ENCOUNTER — Ambulatory Visit (HOSPITAL_BASED_OUTPATIENT_CLINIC_OR_DEPARTMENT_OTHER): Payer: No Typology Code available for payment source | Admitting: Anesthesiology

## 2012-07-16 DIAGNOSIS — IMO0002 Reserved for concepts with insufficient information to code with codable children: Secondary | ICD-10-CM | POA: Insufficient documentation

## 2012-07-16 DIAGNOSIS — X58XXXS Exposure to other specified factors, sequela: Secondary | ICD-10-CM | POA: Insufficient documentation

## 2012-07-16 DIAGNOSIS — Z91040 Latex allergy status: Secondary | ICD-10-CM | POA: Insufficient documentation

## 2012-07-16 DIAGNOSIS — T8489XA Other specified complication of internal orthopedic prosthetic devices, implants and grafts, initial encounter: Secondary | ICD-10-CM | POA: Insufficient documentation

## 2012-07-16 DIAGNOSIS — S52509A Unspecified fracture of the lower end of unspecified radius, initial encounter for closed fracture: Secondary | ICD-10-CM

## 2012-07-16 DIAGNOSIS — F17200 Nicotine dependence, unspecified, uncomplicated: Secondary | ICD-10-CM | POA: Insufficient documentation

## 2012-07-16 DIAGNOSIS — S42309S Unspecified fracture of shaft of humerus, unspecified arm, sequela: Secondary | ICD-10-CM | POA: Insufficient documentation

## 2012-07-16 DIAGNOSIS — Y831 Surgical operation with implant of artificial internal device as the cause of abnormal reaction of the patient, or of later complication, without mention of misadventure at the time of the procedure: Secondary | ICD-10-CM | POA: Insufficient documentation

## 2012-07-16 HISTORY — DX: Anemia, unspecified: D64.9

## 2012-07-16 HISTORY — DX: Headache: R51

## 2012-07-16 HISTORY — PX: ORIF ULNAR FRACTURE: SHX5417

## 2012-07-16 HISTORY — PX: HARDWARE REMOVAL: SHX979

## 2012-07-16 LAB — POCT HEMOGLOBIN-HEMACUE: Hemoglobin: 14 g/dL (ref 12.0–15.0)

## 2012-07-16 SURGERY — OPEN REDUCTION INTERNAL FIXATION (ORIF) ULNAR FRACTURE
Anesthesia: Regional | Site: Wrist | Laterality: Right | Wound class: Clean

## 2012-07-16 MED ORDER — LIDOCAINE HCL (CARDIAC) 20 MG/ML IV SOLN
INTRAVENOUS | Status: DC | PRN
  Administered 2012-07-16: 50 mg via INTRAVENOUS

## 2012-07-16 MED ORDER — CEFAZOLIN SODIUM 1-5 GM-% IV SOLN
1.0000 g | Freq: Once | INTRAVENOUS | Status: AC
  Administered 2012-07-16: 2 g via INTRAVENOUS

## 2012-07-16 MED ORDER — FENTANYL CITRATE 0.05 MG/ML IJ SOLN: 50.0000 ug | INTRAMUSCULAR | Status: DC | PRN

## 2012-07-16 MED ORDER — ONDANSETRON HCL 4 MG/2ML IJ SOLN
INTRAMUSCULAR | Status: DC | PRN
  Administered 2012-07-16: 4 mg via INTRAVENOUS

## 2012-07-16 MED ORDER — HYDROMORPHONE HCL PF 1 MG/ML IJ SOLN: 0.2500 mg | INTRAMUSCULAR | Status: DC | PRN

## 2012-07-16 MED ORDER — OXYCODONE HCL 5 MG/5ML PO SOLN: 5.0000 mg | Freq: Once | ORAL | Status: DC | PRN

## 2012-07-16 MED ORDER — MIDAZOLAM HCL 2 MG/2ML IJ SOLN: 0.5000 mg | INTRAMUSCULAR | Status: DC | PRN

## 2012-07-16 MED ORDER — LACTATED RINGERS IV SOLN
INTRAVENOUS | Status: DC
Start: 2012-07-16 — End: 2012-07-16
  Administered 2012-07-16 (×2): via INTRAVENOUS

## 2012-07-16 MED ORDER — OXYCODONE-ACETAMINOPHEN 5-325 MG PO TABS: 1.0000 | ORAL_TABLET | ORAL | Status: DC | PRN

## 2012-07-16 MED ORDER — FENTANYL CITRATE 0.05 MG/ML IJ SOLN
50.0000 ug | INTRAMUSCULAR | Status: DC | PRN
  Administered 2012-07-16: 100 ug via INTRAVENOUS

## 2012-07-16 MED ORDER — OXYCODONE HCL 5 MG PO TABS: 5.0000 mg | ORAL_TABLET | Freq: Once | ORAL | Status: DC | PRN

## 2012-07-16 MED ORDER — BUPIVACAINE HCL (PF) 0.5 % IJ SOLN
INTRAMUSCULAR | Status: DC | PRN
  Administered 2012-07-16: 8 mL

## 2012-07-16 MED ORDER — CHLORHEXIDINE GLUCONATE 4 % EX LIQD
60.0000 mL | Freq: Once | CUTANEOUS | Status: DC
Start: 2012-07-16 — End: 2012-07-16

## 2012-07-16 MED ORDER — BUPIVACAINE-EPINEPHRINE PF 0.5-1:200000 % IJ SOLN
INTRAMUSCULAR | Status: DC | PRN
  Administered 2012-07-16: 30 mL

## 2012-07-16 MED ORDER — MIDAZOLAM HCL 2 MG/2ML IJ SOLN
1.0000 mg | INTRAMUSCULAR | Status: DC | PRN
  Administered 2012-07-16: 2 mg via INTRAVENOUS

## 2012-07-16 MED ORDER — DEXAMETHASONE SODIUM PHOSPHATE 4 MG/ML IJ SOLN
INTRAMUSCULAR | Status: DC | PRN
  Administered 2012-07-16: 10 mg via INTRAVENOUS

## 2012-07-16 MED ORDER — PROPOFOL 10 MG/ML IV BOLUS
INTRAVENOUS | Status: DC | PRN
  Administered 2012-07-16: 150 mg via INTRAVENOUS

## 2012-07-16 SURGICAL SUPPLY — 81 items
APL SKNCLS STERI-STRIP NONHPOA (GAUZE/BANDAGES/DRESSINGS) ×1
BAG DECANTER FOR FLEXI CONT (MISCELLANEOUS) IMPLANT
BANDAGE ELASTIC 3 VELCRO ST LF (GAUZE/BANDAGES/DRESSINGS) ×2 IMPLANT
BANDAGE ELASTIC 4 VELCRO ST LF (GAUZE/BANDAGES/DRESSINGS) IMPLANT
BANDAGE GAUZE ELAST BULKY 4 IN (GAUZE/BANDAGES/DRESSINGS) ×2 IMPLANT
BENZOIN TINCTURE PRP APPL 2/3 (GAUZE/BANDAGES/DRESSINGS) ×2 IMPLANT
BIT DRILL 2 FAST STEP (BIT) ×2 IMPLANT
BLADE MINI RND TIP GREEN BEAV (BLADE) IMPLANT
BLADE SURG 15 STRL LF DISP TIS (BLADE) ×2 IMPLANT
BLADE SURG 15 STRL SS (BLADE) ×4
BNDG CMPR 9X4 STRL LF SNTH (GAUZE/BANDAGES/DRESSINGS) ×1
BNDG ESMARK 4X9 LF (GAUZE/BANDAGES/DRESSINGS) ×2 IMPLANT
CANISTER SUCTION 1200CC (MISCELLANEOUS) ×2 IMPLANT
CLOTH BEACON ORANGE TIMEOUT ST (SAFETY) ×2 IMPLANT
CORDS BIPOLAR (ELECTRODE) ×2 IMPLANT
COVER TABLE BACK 60X90 (DRAPES) ×2 IMPLANT
CUFF TOURNIQUET SINGLE 18IN (TOURNIQUET CUFF) ×2 IMPLANT
DECANTER SPIKE VIAL GLASS SM (MISCELLANEOUS) IMPLANT
DRAPE EXTREMITY T 121X128X90 (DRAPE) ×2 IMPLANT
DRAPE OEC MINIVIEW 54X84 (DRAPES) ×2 IMPLANT
DRAPE SURG 17X23 STRL (DRAPES) ×2 IMPLANT
DURAPREP 26ML APPLICATOR (WOUND CARE) ×2 IMPLANT
ELECT REM PT RETURN 9FT ADLT (ELECTROSURGICAL)
ELECTRODE REM PT RTRN 9FT ADLT (ELECTROSURGICAL) IMPLANT
GAUZE SPONGE 4X4 16PLY XRAY LF (GAUZE/BANDAGES/DRESSINGS) IMPLANT
GAUZE XEROFORM 1X8 LF (GAUZE/BANDAGES/DRESSINGS) IMPLANT
GLOVE BIO SURGEON STRL SZ8.5 (GLOVE) ×2 IMPLANT
GLOVE SKINSENSE NS SZ6.5 (GLOVE) ×1
GLOVE SKINSENSE NS SZ7.0 (GLOVE) ×1
GLOVE SKINSENSE STRL SZ6.5 (GLOVE) ×1 IMPLANT
GLOVE SKINSENSE STRL SZ7.0 (GLOVE) ×1 IMPLANT
GLOVE SURG SS PI 8.5 STRL IVOR (GLOVE) ×1
GLOVE SURG SS PI 8.5 STRL STRW (GLOVE) ×1 IMPLANT
GOWN BRE IMP PREV XXLGXLNG (GOWN DISPOSABLE) ×2 IMPLANT
GOWN PREVENTION PLUS XLARGE (GOWN DISPOSABLE) ×2 IMPLANT
KWIRE 4.0 X .045IN (WIRE) ×4 IMPLANT
NEEDLE HYPO 25X1 1.5 SAFETY (NEEDLE) ×2 IMPLANT
NS IRRIG 1000ML POUR BTL (IV SOLUTION) ×2 IMPLANT
PACK BASIN DAY SURGERY FS (CUSTOM PROCEDURE TRAY) ×2 IMPLANT
PAD CAST 3X4 CTTN HI CHSV (CAST SUPPLIES) ×1 IMPLANT
PAD CAST 4YDX4 CTTN HI CHSV (CAST SUPPLIES) IMPLANT
PADDING CAST ABS 4INX4YD NS (CAST SUPPLIES)
PADDING CAST ABS COTTON 4X4 ST (CAST SUPPLIES) IMPLANT
PADDING CAST COTTON 3X4 STRL (CAST SUPPLIES) ×2
PADDING CAST COTTON 4X4 STRL (CAST SUPPLIES)
PENCIL BUTTON HOLSTER BLD 10FT (ELECTRODE) IMPLANT
SHEET MEDIUM DRAPE 40X70 STRL (DRAPES) ×2 IMPLANT
SLEEVE SCD COMPRESS KNEE MED (MISCELLANEOUS) ×2 IMPLANT
SLING ARM FOAM STRAP LRG (SOFTGOODS) ×2 IMPLANT
SPLINT PLASTER CAST XFAST 3X15 (CAST SUPPLIES) IMPLANT
SPLINT PLASTER CAST XFAST 4X15 (CAST SUPPLIES) ×10 IMPLANT
SPLINT PLASTER XTRA FAST SET 4 (CAST SUPPLIES) ×10
SPLINT PLASTER XTRA FASTSET 3X (CAST SUPPLIES)
SPONGE GAUZE 4X4 12PLY (GAUZE/BANDAGES/DRESSINGS) ×2 IMPLANT
STOCKINETTE 4X48 STRL (DRAPES) ×2 IMPLANT
STRIP CLOSURE SKIN 1/2X4 (GAUZE/BANDAGES/DRESSINGS) ×2 IMPLANT
SUCTION FRAZIER TIP 10 FR DISP (SUCTIONS) ×2 IMPLANT
SUT ETHILON 3 0 PS 1 (SUTURE) IMPLANT
SUT ETHILON 4 0 PS 2 18 (SUTURE) IMPLANT
SUT ETHILON 5 0 PS 2 18 (SUTURE) IMPLANT
SUT MERSILENE 4 0 P 3 (SUTURE) IMPLANT
SUT MON AB 4-0 PC3 18 (SUTURE) IMPLANT
SUT PROLENE 3 0 PS 2 (SUTURE) ×4 IMPLANT
SUT SILK 2 0 FS (SUTURE) IMPLANT
SUT STEEL 0 (SUTURE) ×2
SUT STEEL 0 18XMFL TIE 17 (SUTURE) ×1 IMPLANT
SUT VIC AB 0 CT3 27 (SUTURE) IMPLANT
SUT VIC AB 0 SH 27 (SUTURE) IMPLANT
SUT VIC AB 2-0 SH 27 (SUTURE) ×2
SUT VIC AB 2-0 SH 27XBRD (SUTURE) ×1 IMPLANT
SUT VIC AB 3-0 FS2 27 (SUTURE) IMPLANT
SUT VIC AB 3-0 PS1 18 (SUTURE)
SUT VIC AB 3-0 PS1 18XBRD (SUTURE) IMPLANT
SUT VICRYL 4-0 PS2 18IN ABS (SUTURE) IMPLANT
SUT VICRYL RAPIDE 4/0 PS 2 (SUTURE) IMPLANT
SYR BULB 3OZ (MISCELLANEOUS) ×2 IMPLANT
SYRINGE 10CC LL (SYRINGE) IMPLANT
TOWEL OR 17X24 6PK STRL BLUE (TOWEL DISPOSABLE) ×2 IMPLANT
TUBE CONNECTING 20X1/4 (TUBING) ×4 IMPLANT
UNDERPAD 30X30 INCONTINENT (UNDERPADS AND DIAPERS) ×2 IMPLANT
WATER STERILE IRR 1000ML POUR (IV SOLUTION) IMPLANT

## 2012-07-16 NOTE — Op Note (Signed)
See dictated note [phone]

## 2012-07-16 NOTE — Brief Op Note (Signed)
07/16/2012  12:17 PM  PATIENT:  Taylor Hood  27 y.o. female  PRE-OPERATIVE DIAGNOSIS:  right distal radius/ulnar fractures,  right wrist stenosing tenosynovitis   POST-OPERATIVE DIAGNOSIS:  right distal radius/ulnar fractures,  right wrist stenosing tenosynovitis   PROCEDURE:  Procedure(s) (LRB) with comments: OPEN REDUCTION INTERNAL FIXATION (ORIF) ULNAR FRACTURE (Right) - Right Wrist Open Reduction Internal Fixation Distal Ulnar, Right Wrist Stenosing Tenosynovitis Release   HARDWARE REMOVAL (Right) - Right Wrist Plate Removal, Scar Revision   SURGEON:  Surgeon(s) and Role:    * Marlowe Shores, MD - Primary  PHYSICIAN ASSISTANT:   ASSISTANTS: Dasnoit  ANESTHESIA:   general  EBL:  Total I/O In: 1500 [I.V.:1500] Out: -   BLOOD ADMINISTERED:none  DRAINS: none   LOCAL MEDICATIONS USED:  NONE  SPECIMEN:  No Specimen  DISPOSITION OF SPECIMEN:  N/A  COUNTS:  YES  TOURNIQUET:  * Missing tourniquet times found for documented tourniquets in log:  [id number] *  DICTATION: .Other Dictation: Dictation Number [id number]  PLAN OF CARE: Discharge to home after PACU  PATIENT DISPOSITION:  PACU - hemodynamically stable.   Delay start of Pharmacological VTE agent (>24hrs) due to surgical blood loss or risk of bleeding: not applicable

## 2012-07-16 NOTE — Anesthesia Postprocedure Evaluation (Signed)
  Anesthesia Post-op Note  Patient: Taylor Hood  Procedure(s) Performed: Procedure(s) (LRB) with comments: OPEN REDUCTION INTERNAL FIXATION (ORIF) ULNAR FRACTURE (Right) - Right Wrist Open Reduction Internal Fixation Distal Ulnar, Right Wrist Stenosing Tenosynovitis Release   HARDWARE REMOVAL (Right) - Right Wrist Plate Removal, Scar Revision   Patient Location: PACU  Anesthesia Type:GA combined with regional for post-op pain  Level of Consciousness: awake  Airway and Oxygen Therapy: Patient Spontanous Breathing  Post-op Pain: none  Post-op Assessment: Post-op Vital signs reviewed, Patient's Cardiovascular Status Stable, Respiratory Function Stable, Patent Airway and No signs of Nausea or vomiting  Post-op Vital Signs: Reviewed and stable  Complications: No apparent anesthesia complications

## 2012-07-16 NOTE — Transfer of Care (Signed)
Immediate Anesthesia Transfer of Care Note  Patient: Taylor Hood  Procedure(s) Performed: Procedure(s) (LRB) with comments: OPEN REDUCTION INTERNAL FIXATION (ORIF) ULNAR FRACTURE (Right) - Right Wrist Open Reduction Internal Fixation Distal Ulnar, Right Wrist Stenosing Tenosynovitis Release   HARDWARE REMOVAL (Right) - Right Wrist Plate Removal, Scar Revision   Patient Location: PACU  Anesthesia Type:GA combined with regional for post-op pain  Level of Consciousness: sedated  Airway & Oxygen Therapy: Patient Spontanous Breathing and Patient connected to face mask oxygen  Post-op Assessment: Report given to PACU RN and Post -op Vital signs reviewed and stable  Post vital signs: Reviewed and stable  Complications: No apparent anesthesia complications
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
## 2012-07-16 NOTE — Anesthesia Preprocedure Evaluation (Signed)
Anesthesia Evaluation  Patient identified by MRN, date of birth, ID band Patient awake    Reviewed: Allergy & Precautions, H&P , NPO status , Patient's Chart, lab work & pertinent test results  Airway Mallampati: II TM Distance: >3 FB Neck ROM: Full    Dental No notable dental hx. (+) Teeth Intact and Dental Advisory Given   Pulmonary neg pulmonary ROS, Current Smoker,  breath sounds clear to auscultation  Pulmonary exam normal       Cardiovascular negative cardio ROS  Rhythm:Regular Rate:Normal     Neuro/Psych  Headaches, negative psych ROS   GI/Hepatic negative GI ROS, Neg liver ROS,   Endo/Other  negative endocrine ROS  Renal/GU negative Renal ROS  negative genitourinary   Musculoskeletal   Abdominal   Peds  Hematology negative hematology ROS (+)   Anesthesia Other Findings   Reproductive/Obstetrics negative OB ROS                           Anesthesia Physical Anesthesia Plan  ASA: II  Anesthesia Plan: General and Regional   Post-op Pain Management:    Induction: Intravenous  Airway Management Planned: LMA  Additional Equipment:   Intra-op Plan:   Post-operative Plan: Extubation in OR  Informed Consent: I have reviewed the patients History and Physical, chart, labs and discussed the procedure including the risks, benefits and alternatives for the proposed anesthesia with the patient or authorized representative who has indicated his/her understanding and acceptance.   Dental advisory given  Plan Discussed with: CRNA  Anesthesia Plan Comments:         Anesthesia Quick Evaluation

## 2012-07-16 NOTE — Progress Notes (Signed)
Assisted Dr. Fitzgerald with right, ultrasound guided, supraclavicular, axillary block. Side rails up, monitors on throughout procedure. See vital signs in flow sheet. Tolerated Procedure well. 

## 2012-07-16 NOTE — Anesthesia Procedure Notes (Addendum)
Anesthesia Regional Block:  Supraclavicular block  Pre-Anesthetic Checklist: ,, timeout performed, Correct Patient, Correct Site, Correct Laterality, Correct Procedure, Correct Position, site marked, Risks and benefits discussed, pre-op evaluation, post-op pain management  Laterality: Right  Prep: Maximum Sterile Barrier Precautions used and chloraprep       Needles:  Injection technique: Single-shot  Needle Type: Echogenic Stimulator Needle     Needle Length: 5cm 5 cm Needle Gauge: 22 and 22 G    Additional Needles:  Procedures: ultrasound guided (picture in chart) Supraclavicular block Narrative:  Start time: 07/16/2012 10:18 AM End time: 07/16/2012 10:30 AM Injection made incrementally with aspirations every 5 mL. Anesthesiologist: Fitzgerald,MD  Additional Notes: 2% Lidocaine skin wheel. Intercostobrachial block with 8cc of 0.5% Bupivicaine plain.  Supraclavicular block Procedure Name: LMA Insertion Date/Time: 07/16/2012 10:57 AM Performed by: Caren Macadam Pre-anesthesia Checklist: Patient identified, Emergency Drugs available, Suction available and Patient being monitored Patient Re-evaluated:Patient Re-evaluated prior to inductionOxygen Delivery Method: Circle System Utilized Preoxygenation: Pre-oxygenation with 100% oxygen Intubation Type: IV induction Ventilation: Mask ventilation without difficulty LMA: LMA inserted LMA Size: 4.0 Number of attempts: 1 Airway Equipment and Method: bite block Placement Confirmation: positive ETCO2 and breath sounds checked- equal and bilateral Tube secured with: Tape Dental Injury: Teeth and Oropharynx as per pre-operative assessment

## 2012-07-17 NOTE — Op Note (Signed)
Taylor Hood, Taylor Hood             ACCOUNT NO.:  [id number]  MEDICAL RECORD NO.:  [id number]  LOCATION:                                 FACILITY:  PHYSICIAN:  Artist Pais. Yechiel Erny, M.D.DATE OF BIRTH:  [birth year]  DATE OF PROCEDURE:  07/16/2012 DATE OF DISCHARGE:                              OPERATIVE REPORT   PREOPERATIVE DIAGNOSES: 1. Retained painful hardware, right distal radius as well as right     ulnar styloid fracture. 2. Right de Quervain tenosynovitis.  POSTOPERATIVE DIAGNOSES: 1. Retained painful hardware, right distal radius as well as right     ulnar styloid fracture. 2. Right de Quervain tenosynovitis.  PROCEDURES: 1. Open reduction and internal fixation of distal ulna, tension band     wiring. 2. Hardware removal, right wrist. 3. Lollie Sails release, right wrist. 4. Scar revision, right wrist.  SURGEON:  Artist Pais. Mina Marble, MD  ASSISTANT:  Jonni Sanger, PA  ANESTHESIA:  General.  COMPLICATIONS:  None.  DRAINS:  None.  SPECIMENS:  None.  DESCRIPTION OF PROCEDURE:  The patient was taken to the operating suite. After induction of adequate general anesthesia, right upper extremity was prepped and draped in sterile fashion.  Esmarch was used to exsanguinate the limb.  Tourniquet was inflated to 250 mmHg.  At this point in time, incision was made along the ulnar border of the distal ulna.  A 3-4 cm skin was incised down to the tip of the distal ulna. Dissection was carried down to the ulnar styloid fracture which was carefully identified.  The fracture site was carefully opened and small bit of fibrous tissue was excised.  At this point in time, two 0.45 K- wires were driven from distal to proximal at an oblique angle, exiting into the main fragment.  Once this was done, a small hole was drilled in the proximal distal ulna and a 26-gauge wire was used to figure-of-eight tension band, this was tightened down.  The K-wires were then bent and buried  into the distal ulnar to complete the tension band fixation.  The wound was thoroughly irrigated and loosely closed in layers of 2-0 undyed Vicryl and a 3-0 Prolene subcuticular stitch on the skin.  Second incision was made over the first dorsal compartment.  This was incised longitudinally.  Care was taken to identify and retract branches of superficial radial nerve.  The first dorsal compartment was incised in its dorsal most extent.  There was a separate tunnel for the EPB tendon. The EPB and APL were lysed all adhesions.  Hypertrophic scar from our initial DVR plate fixation was carefully excised elliptically. Dissection was carried down to the plate which was removed without undue difficulty.  The wound was irrigated and loosely closed in layers of 2-0 undyed Vicryl and a 3-0 Prolene subcuticular stitch on the skin.  The patient tolerated all these procedures well and was placed in a sterile dressing of Steri-Strips, 4x4s, fluffs, and a volar splint, and went to the recovery room in stable fashion.     Artist Pais Mina Marble, M.D.     MAW/MEDQ  D:  07/16/2012  T:  07/17/2012  Job:  [id number]

## 2012-07-18 ENCOUNTER — Encounter (HOSPITAL_BASED_OUTPATIENT_CLINIC_OR_DEPARTMENT_OTHER): Payer: Self-pay | Admitting: Orthopedic Surgery

## 2012-07-24 ENCOUNTER — Ambulatory Visit: Payer: Self-pay | Attending: Orthopedic Surgery | Admitting: Occupational Therapy

## 2012-07-24 DIAGNOSIS — IMO0001 Reserved for inherently not codable concepts without codable children: Secondary | ICD-10-CM | POA: Insufficient documentation

## 2012-07-24 DIAGNOSIS — M25539 Pain in unspecified wrist: Secondary | ICD-10-CM | POA: Insufficient documentation

## 2012-09-25 ENCOUNTER — Ambulatory Visit: Payer: Medicaid Other | Attending: Orthopedic Surgery | Admitting: Occupational Therapy

## 2012-09-25 DIAGNOSIS — M25539 Pain in unspecified wrist: Secondary | ICD-10-CM | POA: Insufficient documentation

## 2012-09-25 DIAGNOSIS — IMO0001 Reserved for inherently not codable concepts without codable children: Secondary | ICD-10-CM | POA: Insufficient documentation

## 2012-10-07 ENCOUNTER — Ambulatory Visit: Payer: Medicaid Other | Attending: Orthopedic Surgery | Admitting: Occupational Therapy

## 2012-10-07 DIAGNOSIS — M25539 Pain in unspecified wrist: Secondary | ICD-10-CM | POA: Insufficient documentation

## 2012-10-07 DIAGNOSIS — IMO0001 Reserved for inherently not codable concepts without codable children: Secondary | ICD-10-CM | POA: Insufficient documentation

## 2012-10-29 ENCOUNTER — Ambulatory Visit: Payer: Medicaid Other | Admitting: Occupational Therapy

## 2012-12-10 ENCOUNTER — Emergency Department (HOSPITAL_COMMUNITY)
Admission: EM | Admit: 2012-12-10 | Discharge: 2012-12-10 | Disposition: A | Discharge status: Home / Self Care | Payer: Medicaid Other | Attending: Emergency Medicine | Admitting: Emergency Medicine

## 2012-12-10 ENCOUNTER — Emergency Department (HOSPITAL_COMMUNITY): Payer: Medicaid Other

## 2012-12-10 DIAGNOSIS — Z862 Personal history of diseases of the blood and blood-forming organs and certain disorders involving the immune mechanism: Secondary | ICD-10-CM | POA: Insufficient documentation

## 2012-12-10 DIAGNOSIS — Z91040 Latex allergy status: Secondary | ICD-10-CM | POA: Insufficient documentation

## 2012-12-10 DIAGNOSIS — S0993XA Unspecified injury of face, initial encounter: Secondary | ICD-10-CM | POA: Insufficient documentation

## 2012-12-10 DIAGNOSIS — IMO0002 Reserved for concepts with insufficient information to code with codable children: Secondary | ICD-10-CM | POA: Insufficient documentation

## 2012-12-10 DIAGNOSIS — Y92410 Unspecified street and highway as the place of occurrence of the external cause: Secondary | ICD-10-CM | POA: Insufficient documentation

## 2012-12-10 DIAGNOSIS — F17200 Nicotine dependence, unspecified, uncomplicated: Secondary | ICD-10-CM | POA: Insufficient documentation

## 2012-12-10 DIAGNOSIS — Z8489 Family history of other specified conditions: Secondary | ICD-10-CM | POA: Insufficient documentation

## 2012-12-10 DIAGNOSIS — Z79899 Other long term (current) drug therapy: Secondary | ICD-10-CM | POA: Insufficient documentation

## 2012-12-10 DIAGNOSIS — S0990XA Unspecified injury of head, initial encounter: Secondary | ICD-10-CM | POA: Insufficient documentation

## 2012-12-10 DIAGNOSIS — S46909A Unspecified injury of unspecified muscle, fascia and tendon at shoulder and upper arm level, unspecified arm, initial encounter: Secondary | ICD-10-CM | POA: Insufficient documentation

## 2012-12-10 DIAGNOSIS — S8990XA Unspecified injury of unspecified lower leg, initial encounter: Secondary | ICD-10-CM | POA: Insufficient documentation

## 2012-12-10 DIAGNOSIS — Y9389 Activity, other specified: Secondary | ICD-10-CM | POA: Insufficient documentation

## 2012-12-10 DIAGNOSIS — Z8669 Personal history of other diseases of the nervous system and sense organs: Secondary | ICD-10-CM | POA: Insufficient documentation

## 2012-12-10 DIAGNOSIS — T07XXXA Unspecified multiple injuries, initial encounter: Secondary | ICD-10-CM | POA: Insufficient documentation

## 2012-12-10 DIAGNOSIS — S4980XA Other specified injuries of shoulder and upper arm, unspecified arm, initial encounter: Secondary | ICD-10-CM | POA: Insufficient documentation

## 2012-12-10 MED ORDER — OXYCODONE-ACETAMINOPHEN 5-325 MG PO TABS
2.0000 | ORAL_TABLET | Freq: Once | ORAL | Status: AC
  Administered 2012-12-10: 2 via ORAL
  Filled 2012-12-10: qty 2

## 2012-12-10 MED ORDER — IBUPROFEN 800 MG PO TABS: 800.0000 mg | ORAL_TABLET | Freq: Three times a day (TID) | ORAL | Status: DC

## 2012-12-10 MED ORDER — METHOCARBAMOL 500 MG PO TABS: 500.0000 mg | ORAL_TABLET | Freq: Two times a day (BID) | ORAL | Status: DC
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
## 2012-12-10 NOTE — ED Notes (Signed)
Pt has a ride home.  

## 2012-12-11 NOTE — ED Provider Notes (Signed)
Medical screening examination/treatment/procedure(s) were performed by non-physician practitioner and as supervising physician I was immediately available for consultation/collaboration.  Calieb Lichtman T Aisling Emigh, MD 12/11/12 1316 

## 2013-01-01 ENCOUNTER — Other Ambulatory Visit: Payer: Self-pay | Admitting: Orthopedic Surgery

## 2013-02-04 ENCOUNTER — Encounter (HOSPITAL_BASED_OUTPATIENT_CLINIC_OR_DEPARTMENT_OTHER): Payer: Self-pay | Admitting: *Deleted

## 2013-02-04 NOTE — Progress Notes (Signed)
Bring all medications

## 2013-02-10 ENCOUNTER — Encounter (HOSPITAL_BASED_OUTPATIENT_CLINIC_OR_DEPARTMENT_OTHER): Payer: Self-pay | Admitting: *Deleted

## 2013-02-11 ENCOUNTER — Encounter (HOSPITAL_BASED_OUTPATIENT_CLINIC_OR_DEPARTMENT_OTHER): Payer: Self-pay | Admitting: Anesthesiology

## 2013-02-11 ENCOUNTER — Ambulatory Visit (HOSPITAL_BASED_OUTPATIENT_CLINIC_OR_DEPARTMENT_OTHER)
Admission: RE | Admit: 2013-02-11 | Discharge: 2013-02-11 | Disposition: A | Discharge status: Home / Self Care | Payer: Medicaid Other | Source: Ambulatory Visit | Attending: Orthopedic Surgery | Admitting: Orthopedic Surgery

## 2013-02-11 ENCOUNTER — Ambulatory Visit (HOSPITAL_BASED_OUTPATIENT_CLINIC_OR_DEPARTMENT_OTHER): Payer: Medicaid Other | Admitting: Anesthesiology

## 2013-02-11 ENCOUNTER — Encounter (HOSPITAL_BASED_OUTPATIENT_CLINIC_OR_DEPARTMENT_OTHER): Payer: Self-pay

## 2013-02-11 ENCOUNTER — Encounter (HOSPITAL_BASED_OUTPATIENT_CLINIC_OR_DEPARTMENT_OTHER)
Admission: RE | Disposition: A | Discharge status: Home / Self Care | Payer: Self-pay | Source: Ambulatory Visit | Attending: Orthopedic Surgery

## 2013-02-11 DIAGNOSIS — D649 Anemia, unspecified: Secondary | ICD-10-CM | POA: Insufficient documentation

## 2013-02-11 DIAGNOSIS — Y929 Unspecified place or not applicable: Secondary | ICD-10-CM | POA: Insufficient documentation

## 2013-02-11 DIAGNOSIS — Y831 Surgical operation with implant of artificial internal device as the cause of abnormal reaction of the patient, or of later complication, without mention of misadventure at the time of the procedure: Secondary | ICD-10-CM | POA: Insufficient documentation

## 2013-02-11 DIAGNOSIS — F17200 Nicotine dependence, unspecified, uncomplicated: Secondary | ICD-10-CM | POA: Insufficient documentation

## 2013-02-11 DIAGNOSIS — G43909 Migraine, unspecified, not intractable, without status migrainosus: Secondary | ICD-10-CM | POA: Insufficient documentation

## 2013-02-11 DIAGNOSIS — M25539 Pain in unspecified wrist: Secondary | ICD-10-CM | POA: Insufficient documentation

## 2013-02-11 DIAGNOSIS — T8489XA Other specified complication of internal orthopedic prosthetic devices, implants and grafts, initial encounter: Secondary | ICD-10-CM | POA: Insufficient documentation

## 2013-02-11 DIAGNOSIS — Z79899 Other long term (current) drug therapy: Secondary | ICD-10-CM | POA: Insufficient documentation

## 2013-02-11 DIAGNOSIS — S52201D Unspecified fracture of shaft of right ulna, subsequent encounter for closed fracture with routine healing: Secondary | ICD-10-CM

## 2013-02-11 HISTORY — PX: HARDWARE REMOVAL: SHX979

## 2013-02-11 LAB — POCT HEMOGLOBIN-HEMACUE: Hemoglobin: 13.1 g/dL (ref 12.0–15.0)

## 2013-02-11 SURGERY — REMOVAL, HARDWARE
Anesthesia: General | Site: Wrist | Laterality: Right | Wound class: Clean

## 2013-02-11 MED ORDER — ONDANSETRON HCL 4 MG/2ML IJ SOLN
INTRAMUSCULAR | Status: DC | PRN
  Administered 2013-02-11: 4 mg via INTRAVENOUS

## 2013-02-11 MED ORDER — LACTATED RINGERS IV SOLN
INTRAVENOUS | Status: DC
  Administered 2013-02-11 (×2): via INTRAVENOUS

## 2013-02-11 MED ORDER — CEFAZOLIN SODIUM-DEXTROSE 2-3 GM-% IV SOLR
2.0000 g | INTRAVENOUS | Status: AC
  Administered 2013-02-11: 2 g via INTRAVENOUS

## 2013-02-11 MED ORDER — BUPIVACAINE HCL (PF) 0.25 % IJ SOLN
INTRAMUSCULAR | Status: DC | PRN
  Administered 2013-02-11: 10 mL

## 2013-02-11 MED ORDER — OXYCODONE HCL 5 MG PO TABS
5.0000 mg | ORAL_TABLET | Freq: Once | ORAL | Status: AC | PRN
  Administered 2013-02-11: 5 mg via ORAL

## 2013-02-11 MED ORDER — FENTANYL CITRATE 0.05 MG/ML IJ SOLN
INTRAMUSCULAR | Status: DC | PRN
  Administered 2013-02-11 (×2): 50 ug via INTRAVENOUS

## 2013-02-11 MED ORDER — LIDOCAINE HCL (CARDIAC) 20 MG/ML IV SOLN
INTRAVENOUS | Status: DC | PRN
  Administered 2013-02-11: 80 mg via INTRAVENOUS

## 2013-02-11 MED ORDER — HYDROMORPHONE HCL PF 1 MG/ML IJ SOLN
0.2500 mg | INTRAMUSCULAR | Status: DC | PRN
  Administered 2013-02-11: 0.5 mg via INTRAVENOUS
  Administered 2013-02-11: 0.25 mg via INTRAVENOUS

## 2013-02-11 MED ORDER — OXYCODONE-ACETAMINOPHEN 5-325 MG PO TABS: 1.0000 | ORAL_TABLET | ORAL | Status: DC | PRN

## 2013-02-11 MED ORDER — MIDAZOLAM HCL 2 MG/2ML IJ SOLN: 1.0000 mg | INTRAMUSCULAR | Status: DC | PRN

## 2013-02-11 MED ORDER — CHLORHEXIDINE GLUCONATE 4 % EX LIQD: 60.0000 mL | Freq: Once | CUTANEOUS | Status: DC

## 2013-02-11 MED ORDER — OXYCODONE HCL 5 MG/5ML PO SOLN: 5.0000 mg | Freq: Once | ORAL | Status: AC | PRN

## 2013-02-11 MED ORDER — ONDANSETRON HCL 4 MG/2ML IJ SOLN: 4.0000 mg | Freq: Once | INTRAMUSCULAR | Status: DC | PRN

## 2013-02-11 MED ORDER — DEXAMETHASONE SODIUM PHOSPHATE 4 MG/ML IJ SOLN
INTRAMUSCULAR | Status: DC | PRN
  Administered 2013-02-11: 10 mg via INTRAVENOUS

## 2013-02-11 MED ORDER — MIDAZOLAM HCL 5 MG/5ML IJ SOLN
INTRAMUSCULAR | Status: DC | PRN
  Administered 2013-02-11: 2 mg via INTRAVENOUS

## 2013-02-11 MED ORDER — PROPOFOL 10 MG/ML IV BOLUS
INTRAVENOUS | Status: DC | PRN
  Administered 2013-02-11: 200 mg via INTRAVENOUS

## 2013-02-11 MED ORDER — MIDAZOLAM HCL 2 MG/ML PO SYRP: 12.0000 mg | ORAL_SOLUTION | Freq: Once | ORAL | Status: DC | PRN

## 2013-02-11 MED ORDER — FENTANYL CITRATE 0.05 MG/ML IJ SOLN
50.0000 ug | INTRAMUSCULAR | Status: DC | PRN
Start: 2013-02-11 — End: 2013-02-11

## 2013-02-11 SURGICAL SUPPLY — 56 items
APL SKNCLS STERI-STRIP NONHPOA (GAUZE/BANDAGES/DRESSINGS) ×1
BANDAGE ELASTIC 3 VELCRO ST LF (GAUZE/BANDAGES/DRESSINGS) ×2 IMPLANT
BANDAGE ELASTIC 4 VELCRO ST LF (GAUZE/BANDAGES/DRESSINGS) ×2 IMPLANT
BANDAGE GAUZE ELAST BULKY 4 IN (GAUZE/BANDAGES/DRESSINGS) ×2 IMPLANT
BENZOIN TINCTURE PRP APPL 2/3 (GAUZE/BANDAGES/DRESSINGS) ×2 IMPLANT
BLADE MINI RND TIP GREEN BEAV (BLADE) IMPLANT
BLADE SURG 15 STRL LF DISP TIS (BLADE) ×1 IMPLANT
BLADE SURG 15 STRL SS (BLADE) ×2
BNDG CMPR 9X4 STRL LF SNTH (GAUZE/BANDAGES/DRESSINGS) ×1
BNDG ESMARK 4X9 LF (GAUZE/BANDAGES/DRESSINGS) ×2 IMPLANT
CLOTH BEACON ORANGE TIMEOUT ST (SAFETY) ×2 IMPLANT
CORDS BIPOLAR (ELECTRODE) ×2 IMPLANT
COVER TABLE BACK 60X90 (DRAPES) ×2 IMPLANT
CUFF TOURNIQUET SINGLE 18IN (TOURNIQUET CUFF) ×2 IMPLANT
DECANTER SPIKE VIAL GLASS SM (MISCELLANEOUS) IMPLANT
DRAPE EXTREMITY T 121X128X90 (DRAPE) ×2 IMPLANT
DRAPE OEC MINIVIEW 54X84 (DRAPES) IMPLANT
DRAPE SURG 17X23 STRL (DRAPES) ×2 IMPLANT
DURAPREP 26ML APPLICATOR (WOUND CARE) ×2 IMPLANT
GAUZE SPONGE 4X4 16PLY XRAY LF (GAUZE/BANDAGES/DRESSINGS) IMPLANT
GAUZE XEROFORM 1X8 LF (GAUZE/BANDAGES/DRESSINGS) IMPLANT
GLOVE BIO SURGEON STRL SZ8 (GLOVE) ×2 IMPLANT
GLOVE BIOGEL PI IND STRL 8 (GLOVE) ×1 IMPLANT
GLOVE BIOGEL PI INDICATOR 8 (GLOVE) ×1
GLOVE SURG SS PI 8.5 STRL IVOR (GLOVE) ×1
GLOVE SURG SS PI 8.5 STRL STRW (GLOVE) ×1 IMPLANT
GOWN BRE IMP PREV XXLGXLNG (GOWN DISPOSABLE) ×2 IMPLANT
GOWN PREVENTION PLUS XLARGE (GOWN DISPOSABLE) IMPLANT
GOWN PREVENTION PLUS XXLARGE (GOWN DISPOSABLE) ×2 IMPLANT
NEEDLE HYPO 25X1 1.5 SAFETY (NEEDLE) IMPLANT
PACK BASIN DAY SURGERY FS (CUSTOM PROCEDURE TRAY) ×2 IMPLANT
PAD CAST 4YDX4 CTTN HI CHSV (CAST SUPPLIES) ×1 IMPLANT
PADDING CAST ABS 4INX4YD NS (CAST SUPPLIES) ×1
PADDING CAST ABS COTTON 4X4 ST (CAST SUPPLIES) ×1 IMPLANT
PADDING CAST COTTON 4X4 STRL (CAST SUPPLIES) ×2
SHEET MEDIUM DRAPE 40X70 STRL (DRAPES) ×4 IMPLANT
SPLINT PLASTER CAST XFAST 4X15 (CAST SUPPLIES) ×10 IMPLANT
SPLINT PLASTER XTRA FAST SET 4 (CAST SUPPLIES) ×10
SPONGE GAUZE 4X4 12PLY (GAUZE/BANDAGES/DRESSINGS) ×2 IMPLANT
STOCKINETTE 4X48 STRL (DRAPES) ×2 IMPLANT
STRIP CLOSURE SKIN 1/2X4 (GAUZE/BANDAGES/DRESSINGS) ×2 IMPLANT
SUT ETHILON 3 0 PS 1 (SUTURE) IMPLANT
SUT ETHILON 5 0 PS 2 18 (SUTURE) IMPLANT
SUT MON AB 4-0 PC3 18 (SUTURE) IMPLANT
SUT PROLENE 3 0 PS 2 (SUTURE) IMPLANT
SUT VIC AB 0 CT3 27 (SUTURE) IMPLANT
SUT VIC AB 0 SH 27 (SUTURE) IMPLANT
SUT VIC AB 2-0 SH 27 (SUTURE)
SUT VIC AB 2-0 SH 27XBRD (SUTURE) IMPLANT
SUT VIC AB 3-0 PS1 18 (SUTURE)
SUT VIC AB 3-0 PS1 18XBRD (SUTURE) IMPLANT
SUT VICRYL 4-0 PS2 18IN ABS (SUTURE) ×2 IMPLANT
SUT VICRYL RAPIDE 4/0 PS 2 (SUTURE) ×2 IMPLANT
SYR BULB 3OZ (MISCELLANEOUS) ×2 IMPLANT
SYRINGE 10CC LL (SYRINGE) ×2 IMPLANT
UNDERPAD 30X30 INCONTINENT (UNDERPADS AND DIAPERS) ×2 IMPLANT

## 2013-02-11 NOTE — Transfer of Care (Signed)
Immediate Anesthesia Transfer of Care Note  Patient: Taylor Hood  Procedure(s) Performed: Procedure(s): REMOVAL HARDWARE RIGHT WRIST  (Right)  Patient Location: PACU  Anesthesia Type:General  Level of Consciousness: sedated  Airway & Oxygen Therapy: Patient Spontanous Breathing and Patient connected to face mask oxygen  Post-op Assessment: Report given to PACU RN and Post -op Vital signs reviewed and stable  Post vital signs: Reviewed and stable  Complications: No apparent anesthesia complications

## 2013-02-11 NOTE — Op Note (Signed)
See note [id number]
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
## 2013-02-11 NOTE — Anesthesia Preprocedure Evaluation (Signed)

## 2013-02-11 NOTE — Anesthesia Postprocedure Evaluation (Signed)
  Anesthesia Post-op Note  Patient: Taylor Hood  Procedure(s) Performed: Procedure(s): REMOVAL HARDWARE RIGHT WRIST  (Right)  Patient Location: PACU  Anesthesia Type:General  Level of Consciousness: awake  Airway and Oxygen Therapy: Patient Spontanous Breathing and Patient connected to face mask oxygen  Post-op Pain: none  Post-op Assessment: Post-op Vital signs reviewed  Post-op Vital Signs: Reviewed  Complications: No apparent anesthesia complications

## 2013-02-12 ENCOUNTER — Encounter (HOSPITAL_BASED_OUTPATIENT_CLINIC_OR_DEPARTMENT_OTHER): Payer: Self-pay | Admitting: Orthopedic Surgery

## 2013-02-12 NOTE — Op Note (Signed)
Hood, Taylor             ACCOUNT NO.:  [id number]  MEDICAL RECORD NO.:  [id number]  LOCATION:                               FACILITY:  [facility]  PHYSICIAN:  Artist Pais. Shoshanna Mcquitty, M.D.DATE OF BIRTH:  1986-03-10  DATE OF PROCEDURE:  02/11/2013 DATE OF DISCHARGE:  02/11/2013                              OPERATIVE REPORT   PREOPERATIVE DIAGNOSIS:  Retained painful hardware, right wrist status post open reduction and internal fixation, distal ulna styloid fracture, right side.  POSTOPERATIVE DIAGNOSIS:  Retained painful hardware, right wrist status post open reduction and internal fixation, distal ulna styloid fracture, right side.  PROCEDURE:  Hardware removal, and neurolysis of the superficial ulnar sensory nerve.  SURGEON:  Artist Pais. Mina Marble, M.D.  ASSISTANT:  None.  ANESTHESIA:  General.  No complication.  No drains.  The patient was taken to the operating suite.  After induction of adequate general anesthesia, right upper extremity was prepped and draped in sterile fashion.  An Esmarch was used to exsanguinate the limb.  Tourniquet was inflated to 250 mmHg.  At this point in time, her previous incision was used over the ulnar side of the wrist, starting at the tip of the styloid bone approximately 3-4 cm.  Skin was incised sharply.  Dissection was carried down to the ulnar styloid.  By careful dissection, the underlying soft tissues include the superficial dorsal ulnar sensory branch was undertaken.  There was significant scarring about the nerve root was carefully neurolysed the length of the incision.  The tension band wire was then carefully identified and removed.  The part of the removal did involve opening the small part of the 6th dorsal compartment.  The ECU tendon was carefully retracted dorsally.  After that, tension band had been removed.  The wound was thoroughly irrigated and then the sheath overlying the extensor carpi ulnaris tendon was closed with  4-0 Vicryl and the skin with 4-0 Vicryl Rapide in a subcuticular stitch.  Steri-Strips, 4x4s, fluffs and ulnar gutter splint was applied.  The patient tolerated the procedure well and went to recovery room in stable fashion.     Artist Pais Mina Marble, M.D.    MAW/MEDQ  D:  02/11/2013  T:  02/11/2013  Job:  [id number]

## 2013-05-29 ENCOUNTER — Other Ambulatory Visit: Payer: Self-pay

## 2013-05-29 LAB — HCG, QUANTITATIVE, PREGNANCY: Beta Hcg, Quant.: 8757 m[IU]/mL — ABNORMAL HIGH

## 2013-10-01 ENCOUNTER — Encounter: Payer: Self-pay | Admitting: Nurse Practitioner

## 2013-10-28 ENCOUNTER — Encounter (HOSPITAL_COMMUNITY): Payer: Self-pay | Admitting: Emergency Medicine

## 2013-10-28 ENCOUNTER — Emergency Department (HOSPITAL_COMMUNITY)
Admission: EM | Admit: 2013-10-28 | Discharge: 2013-10-29 | Disposition: A | Discharge status: Home / Self Care | Payer: Medicaid Other | Attending: Emergency Medicine | Admitting: Emergency Medicine

## 2013-10-28 DIAGNOSIS — B349 Viral infection, unspecified: Secondary | ICD-10-CM

## 2013-10-28 DIAGNOSIS — B9789 Other viral agents as the cause of diseases classified elsewhere: Secondary | ICD-10-CM | POA: Insufficient documentation

## 2013-10-28 DIAGNOSIS — R109 Unspecified abdominal pain: Secondary | ICD-10-CM | POA: Insufficient documentation

## 2013-10-28 DIAGNOSIS — Z79899 Other long term (current) drug therapy: Secondary | ICD-10-CM | POA: Insufficient documentation

## 2013-10-28 DIAGNOSIS — R000 Tachycardia, unspecified: Secondary | ICD-10-CM | POA: Insufficient documentation

## 2013-10-28 DIAGNOSIS — M259 Joint disorder, unspecified: Secondary | ICD-10-CM | POA: Insufficient documentation

## 2013-10-28 DIAGNOSIS — Z91040 Latex allergy status: Secondary | ICD-10-CM | POA: Insufficient documentation

## 2013-10-28 DIAGNOSIS — Z87891 Personal history of nicotine dependence: Secondary | ICD-10-CM | POA: Insufficient documentation

## 2013-10-28 DIAGNOSIS — M899 Disorder of bone, unspecified: Secondary | ICD-10-CM | POA: Insufficient documentation

## 2013-10-28 DIAGNOSIS — M545 Low back pain, unspecified: Secondary | ICD-10-CM | POA: Insufficient documentation

## 2013-10-28 DIAGNOSIS — Z7982 Long term (current) use of aspirin: Secondary | ICD-10-CM | POA: Insufficient documentation

## 2013-10-28 DIAGNOSIS — O9989 Other specified diseases and conditions complicating pregnancy, childbirth and the puerperium: Secondary | ICD-10-CM | POA: Insufficient documentation

## 2013-10-28 DIAGNOSIS — Z8669 Personal history of other diseases of the nervous system and sense organs: Secondary | ICD-10-CM | POA: Insufficient documentation

## 2013-10-28 DIAGNOSIS — J029 Acute pharyngitis, unspecified: Secondary | ICD-10-CM | POA: Insufficient documentation

## 2013-10-28 DIAGNOSIS — Z862 Personal history of diseases of the blood and blood-forming organs and certain disorders involving the immune mechanism: Secondary | ICD-10-CM | POA: Insufficient documentation

## 2013-10-28 DIAGNOSIS — Z8679 Personal history of other diseases of the circulatory system: Secondary | ICD-10-CM | POA: Insufficient documentation

## 2013-10-28 HISTORY — DX: Gestational (pregnancy-induced) hypertension without significant proteinuria, unspecified trimester: O13.9

## 2013-10-28 LAB — BASIC METABOLIC PANEL
BUN: 4 mg/dL — AB (ref 6–23)
CHLORIDE: 98 meq/L (ref 96–112)
CO2: 22 mEq/L (ref 19–32)
Calcium: 9 mg/dL (ref 8.4–10.5)
Creatinine, Ser: 0.46 mg/dL — ABNORMAL LOW (ref 0.50–1.10)
GFR calc Af Amer: >90 mL/min (ref >90)
GFR calc non Af Amer: >90 mL/min (ref >90)
GLUCOSE: 70 mg/dL (ref 70–99)
POTASSIUM: 3.2 meq/L — AB (ref 3.7–5.3)
SODIUM: 133 meq/L — AB (ref 137–147)

## 2013-10-28 LAB — CBC WITH DIFFERENTIAL/PLATELET
Basophils Absolute: 0 10*3/uL (ref 0.0–0.1)
Basophils Relative: 0 % (ref 0–1)
Eosinophils Absolute: 0 10*3/uL (ref 0.0–0.7)
Eosinophils Relative: 0 % (ref 0–5)
HCT: 29.6 % — ABNORMAL LOW (ref 36.0–46.0)
HEMOGLOBIN: 10.1 g/dL — AB (ref 12.0–15.0)
LYMPHS ABS: 0.8 10*3/uL (ref 0.7–4.0)
LYMPHS PCT: 10 % — AB (ref 12–46)
MCH: 30.5 pg (ref 26.0–34.0)
MCHC: 34.1 g/dL (ref 30.0–36.0)
MCV: 89.4 fL (ref 78.0–100.0)
MONOS PCT: 11 % (ref 3–12)
Monocytes Absolute: 1 10*3/uL (ref 0.1–1.0)
NEUTROS ABS: 6.8 10*3/uL (ref 1.7–7.7)
NEUTROS PCT: 79 % — AB (ref 43–77)
PLATELETS: 225 10*3/uL (ref 150–400)
RBC: 3.31 MIL/uL — AB (ref 3.87–5.11)
RDW: 13 % (ref 11.5–15.5)
WBC: 8.6 10*3/uL (ref 4.0–10.5)

## 2013-10-28 LAB — URINALYSIS, ROUTINE W REFLEX MICROSCOPIC
BILIRUBIN URINE: NEGATIVE
Glucose, UA: NEGATIVE mg/dL
HGB URINE DIPSTICK: NEGATIVE
Ketones, ur: NEGATIVE mg/dL
Leukocytes, UA: NEGATIVE
NITRITE: NEGATIVE
PH: 6.5 (ref 5.0–8.0)
Protein, ur: NEGATIVE mg/dL
SPECIFIC GRAVITY, URINE: 1.008 (ref 1.005–1.030)
UROBILINOGEN UA: 1 mg/dL (ref 0.0–1.0)

## 2013-10-28 LAB — RAPID STREP SCREEN (MED CTR MEBANE ONLY): STREPTOCOCCUS, GROUP A SCREEN (DIRECT): NEGATIVE

## 2013-10-28 MED ORDER — AZITHROMYCIN 250 MG PO TABS: 250.0000 mg | ORAL_TABLET | Freq: Every day | ORAL | Status: DC

## 2013-10-28 MED ORDER — ACETAMINOPHEN 500 MG PO TABS
1000.0000 mg | ORAL_TABLET | Freq: Once | ORAL | Status: AC
  Administered 2013-10-28: 1000 mg via ORAL
  Filled 2013-10-28: qty 2

## 2013-10-28 NOTE — ED Notes (Signed)
OB rapid response nurse at bedside for eval.
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
## 2013-10-28 NOTE — ED Notes (Addendum)
OB Rapid Response called at this time per PA Browning.

## 2013-10-28 NOTE — Progress Notes (Signed)
Called to see pt with vague complaints of sore throat, leg pain, thigh pain and back pain. On arrival pt A&O x 4 with no OB complaints. [facility] at [facility] [place]. Plan is for a repeat c/s. Pt denies UCs and reports excellent fetal movement. Plan 20 min NST and cleared from OB by EDP

## 2013-10-28 NOTE — Discharge Instructions (Signed)
Cough, Adult ° A cough is a reflex that helps clear your throat and airways. It can help heal the body or may be a reaction to an irritated airway. A cough may only last 2 or 3 weeks (acute) or may last more than 8 weeks (chronic).  °CAUSES °Acute cough: °· Viral or bacterial infections. °Chronic cough: °· Infections. °· Allergies. °· Asthma. °· Post-nasal drip. °· Smoking. °· Heartburn or acid reflux. °· Some medicines. °· Chronic lung problems (COPD). °· Cancer. °SYMPTOMS  °· Cough. °· Fever. °· Chest pain. °· Increased breathing rate. °· High-pitched whistling sound when breathing (wheezing). °· Colored mucus that you cough up (sputum). °TREATMENT  °· A bacterial cough may be treated with antibiotic medicine. °· A viral cough must run its course and will not respond to antibiotics. °· Your caregiver may recommend other treatments if you have a chronic cough. °HOME CARE INSTRUCTIONS  °· Only take over-the-counter or prescription medicines for pain, discomfort, or fever as directed by your caregiver. Use cough suppressants only as directed by your caregiver. °· Use a cold steam vaporizer or humidifier in your bedroom or home to help loosen secretions. °· Sleep in a semi-upright position if your cough is worse at night. °· Rest as needed. °· Stop smoking if you smoke. °SEEK IMMEDIATE MEDICAL CARE IF:  °· You have pus in your sputum. °· Your cough starts to worsen. °· You cannot control your cough with suppressants and are losing sleep. °· You begin coughing up blood. °· You have difficulty breathing. °· You develop pain which is getting worse or is uncontrolled with medicine. °· You have a fever. °MAKE SURE YOU:  °· Understand these instructions. °· Will watch your condition. °· Will get help right away if you are not doing well or get worse. °Document Released: 12/15/2010 Document Revised: 09/10/2011 Document Reviewed: 12/15/2010 °ExitCare® Patient Information ©2014 [employer]. °Viral Infections °A virus is a  type of germ. Viruses can cause: °· Minor sore throats. °· Aches and pains. °· Headaches. °· Runny nose. °· Rashes. °· Watery eyes. °· Tiredness. °· Coughs. °· Loss of appetite. °· Feeling sick to your stomach (nausea). °· Throwing up (vomiting). °· Watery poop (diarrhea). °HOME CARE  °· Only take medicines as told by your doctor. °· Drink enough water and fluids to keep your pee (urine) clear or pale yellow. Sports drinks are a good choice. °· Get plenty of rest and eat healthy. Soups and broths with crackers or rice are fine. °GET HELP RIGHT AWAY IF:  °· You have a very bad headache. °· You have shortness of breath. °· You have chest pain or neck pain. °· You have an unusual rash. °· You cannot stop throwing up. °· You have watery poop that does not stop. °· You cannot keep fluids down. °· You or your child has a temperature by mouth above 102° F (38.9° C), not controlled by medicine. °· Your baby is older than 3 months with a rectal temperature of 102° F (38.9° C) or higher. °· Your baby is 3 months old or younger with a rectal temperature of 100.4° F (38° C) or higher. °MAKE SURE YOU:  °· Understand these instructions. °· Will watch this condition. °· Will get help right away if you are not doing well or get worse. °Document Released: 05/31/2008 Document Revised: 09/10/2011 Document Reviewed: 10/24/2010 °ExitCare® Patient Information ©2014 [employer]. ° °
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
## 2013-10-28 NOTE — ED Notes (Signed)
FHR obtained at this time via dopplar.  Pt reports feeling baby kicking and moving around appropriately.

## 2013-10-28 NOTE — ED Notes (Signed)
Pt states she has not felt good for the past few days  Pt is c/o sore throat, chills, headache, body aches, fatigue, decreased appetite, sides and lower back hurt  Pt states today she coughed up a blood clot  Pt has nausea  Pt states she is 26.[redacted] weeks pregnant  Pt not having spotting or bleeding

## 2013-10-29 NOTE — ED Provider Notes (Signed)
Medical screening examination/treatment/procedure(s) were performed by non-physician practitioner and as supervising physician I was immediately available for consultation/collaboration.   Celene KrasJon R Jakeel Starliper, MD 10/29/13 0000

## 2013-10-30 ENCOUNTER — Encounter: Payer: Self-pay | Admitting: Nurse Practitioner

## 2013-10-30 LAB — CULTURE, GROUP A STREP

## 2013-12-21 ENCOUNTER — Encounter (HOSPITAL_COMMUNITY): Payer: Self-pay

## 2014-01-10 ENCOUNTER — Observation Stay: Payer: Self-pay

## 2014-01-10 LAB — PIH PROFILE
ANION GAP: 10 (ref 7–16)
BUN: 6 mg/dL — ABNORMAL LOW (ref 7–18)
CALCIUM: 8.4 mg/dL — AB (ref 8.5–10.1)
CHLORIDE: 106 mmol/L (ref 98–107)
CO2: 24 mmol/L (ref 21–32)
Creatinine: 0.59 mg/dL — ABNORMAL LOW (ref 0.60–1.30)
EGFR (Non-African Amer.): >60
GLUCOSE: 68 mg/dL (ref 65–99)
HCT: 31.3 % — AB (ref 35.0–47.0)
HGB: 10.3 g/dL — AB (ref 12.0–16.0)
MCH: 29.8 pg (ref 26.0–34.0)
MCHC: 32.9 g/dL (ref 32.0–36.0)
MCV: 91 fL (ref 80–100)
OSMOLALITY: 275 (ref 275–301)
Platelet: 247 10*3/uL (ref 150–440)
Potassium: 3.1 mmol/L — ABNORMAL LOW (ref 3.5–5.1)
RBC: 3.46 10*6/uL — ABNORMAL LOW (ref 3.80–5.20)
RDW: 14 % (ref 11.5–14.5)
SGOT(AST): 16 U/L (ref 15–37)
SODIUM: 140 mmol/L (ref 136–145)
URIC ACID: 4.2 mg/dL (ref 2.6–6.0)
WBC: 5.3 10*3/uL (ref 3.6–11.0)

## 2014-01-10 LAB — PROTEIN / CREATININE RATIO, URINE
CREATININE, URINE: 105 mg/dL (ref 30.0–125.0)
PROTEIN, RANDOM URINE: 22 mg/dL — AB (ref 0–12)
PROTEIN/CREAT. RATIO: 210 mg/g{creat} — AB (ref 0–200)

## 2014-01-11 ENCOUNTER — Ambulatory Visit: Payer: Self-pay | Admitting: Obstetrics & Gynecology

## 2014-01-11 LAB — CBC WITH DIFFERENTIAL/PLATELET
Basophil #: 0 10*3/uL (ref 0.0–0.1)
Basophil %: 0.5 %
EOS PCT: 0.5 %
Eosinophil #: 0 10*3/uL (ref 0.0–0.7)
HCT: 30.5 % — AB (ref 35.0–47.0)
HGB: 9.9 g/dL — AB (ref 12.0–16.0)
LYMPHS ABS: 1.2 10*3/uL (ref 1.0–3.6)
LYMPHS PCT: 22.9 %
MCH: 29.7 pg (ref 26.0–34.0)
MCHC: 32.4 g/dL (ref 32.0–36.0)
MCV: 91 fL (ref 80–100)
MONO ABS: 0.5 x10 3/mm (ref 0.2–0.9)
MONOS PCT: 9.5 %
Neutrophil #: 3.5 10*3/uL (ref 1.4–6.5)
Neutrophil %: 66.6 %
PLATELETS: 257 10*3/uL (ref 150–440)
RBC: 3.33 10*6/uL — ABNORMAL LOW (ref 3.80–5.20)
RDW: 13.7 % (ref 11.5–14.5)
WBC: 5.3 10*3/uL (ref 3.6–11.0)

## 2014-01-12 ENCOUNTER — Inpatient Hospital Stay: Payer: Self-pay | Admitting: Obstetrics & Gynecology

## 2014-01-12 LAB — PROTEIN / CREATININE RATIO, URINE
Creatinine, Urine: 12.7 mg/dL — ABNORMAL LOW (ref 30.0–125.0)
Protein, Random Urine: 7 mg/dL (ref 0–12)
Protein/Creat. Ratio: 551 mg/gCREAT — ABNORMAL HIGH (ref 0–200)

## 2014-01-12 LAB — PIH PROFILE
ANION GAP: 4 — AB (ref 7–16)
AST: 25 U/L (ref 15–37)
BUN: 4 mg/dL — AB (ref 7–18)
CALCIUM: 8.4 mg/dL — AB (ref 8.5–10.1)
CREATININE: 0.55 mg/dL — AB (ref 0.60–1.30)
Chloride: 106 mmol/L (ref 98–107)
Co2: 28 mmol/L (ref 21–32)
EGFR (African American): >60
EGFR (Non-African Amer.): >60
GLUCOSE: 75 mg/dL (ref 65–99)
HCT: 33.9 % — AB (ref 35.0–47.0)
HGB: 11.5 g/dL — ABNORMAL LOW (ref 12.0–16.0)
MCH: 30.5 pg (ref 26.0–34.0)
MCHC: 33.8 g/dL (ref 32.0–36.0)
MCV: 90 fL (ref 80–100)
Osmolality: 271 (ref 275–301)
Platelet: 261 10*3/uL (ref 150–440)
Potassium: 3.1 mmol/L — ABNORMAL LOW (ref 3.5–5.1)
RBC: 3.76 10*6/uL — ABNORMAL LOW (ref 3.80–5.20)
RDW: 14.1 % (ref 11.5–14.5)
Sodium: 138 mmol/L (ref 136–145)
Uric Acid: 3.6 mg/dL (ref 2.6–6.0)
WBC: 8.7 10*3/uL (ref 3.6–11.0)

## 2014-01-13 LAB — HEMATOCRIT: HCT: 28.2 % — ABNORMAL LOW (ref 35.0–47.0)

## 2014-03-17 ENCOUNTER — Other Ambulatory Visit (HOSPITAL_BASED_OUTPATIENT_CLINIC_OR_DEPARTMENT_OTHER): Payer: Self-pay | Admitting: Internal Medicine

## 2014-03-17 DIAGNOSIS — N926 Irregular menstruation, unspecified: Secondary | ICD-10-CM

## 2014-03-22 ENCOUNTER — Encounter: Payer: Self-pay | Admitting: Diagnostic Neuroimaging

## 2014-03-22 ENCOUNTER — Ambulatory Visit (INDEPENDENT_AMBULATORY_CARE_PROVIDER_SITE_OTHER): Payer: Medicaid Other | Admitting: Diagnostic Neuroimaging

## 2014-03-22 ENCOUNTER — Encounter (INDEPENDENT_AMBULATORY_CARE_PROVIDER_SITE_OTHER): Payer: Self-pay

## 2014-03-22 VITALS — BP 131/85 | HR 90 | Temp 98.0°F | Ht 66.0 in | Wt 139.6 lb

## 2014-03-22 DIAGNOSIS — M79641 Pain in right hand: Secondary | ICD-10-CM

## 2014-03-22 DIAGNOSIS — M79609 Pain in unspecified limb: Secondary | ICD-10-CM

## 2014-03-22 DIAGNOSIS — G5620 Lesion of ulnar nerve, unspecified upper limb: Secondary | ICD-10-CM

## 2014-03-22 DIAGNOSIS — G5621 Lesion of ulnar nerve, right upper limb: Secondary | ICD-10-CM

## 2014-03-22 DIAGNOSIS — R200 Anesthesia of skin: Secondary | ICD-10-CM

## 2014-03-22 DIAGNOSIS — R202 Paresthesia of skin: Secondary | ICD-10-CM

## 2014-03-22 DIAGNOSIS — R209 Unspecified disturbances of skin sensation: Secondary | ICD-10-CM

## 2014-03-22 NOTE — Patient Instructions (Signed)
Follow up with Dr. Leanor Kail.
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
## 2014-03-23 ENCOUNTER — Ambulatory Visit (HOSPITAL_BASED_OUTPATIENT_CLINIC_OR_DEPARTMENT_OTHER)
Admission: RE | Admit: 2014-03-23 | Discharge: 2014-03-23 | Disposition: A | Discharge status: Home / Self Care | Payer: Medicaid Other | Source: Ambulatory Visit | Attending: Internal Medicine | Admitting: Internal Medicine

## 2014-03-23 ENCOUNTER — Encounter (HOSPITAL_BASED_OUTPATIENT_CLINIC_OR_DEPARTMENT_OTHER): Payer: Self-pay

## 2014-03-23 DIAGNOSIS — N898 Other specified noninflammatory disorders of vagina: Secondary | ICD-10-CM | POA: Insufficient documentation

## 2014-03-23 DIAGNOSIS — N926 Irregular menstruation, unspecified: Secondary | ICD-10-CM

## 2014-05-03 ENCOUNTER — Encounter (HOSPITAL_BASED_OUTPATIENT_CLINIC_OR_DEPARTMENT_OTHER): Payer: Self-pay

## 2014-10-23 NOTE — Op Note (Signed)
PATIENT NAME:  Taylor Hood, Leanor D MR#:  [id number] DATE OF BIRTH:  08-02-1985  DATE OF PROCEDURE:  01/12/2014  PREOPERATIVE DIAGNOSES: Term pregnancy at 37 weeks, gestational hypertension, prior history of cesarean section.   POSTOPERATIVE DIAGNOSES: Term pregnancy at 37 weeks, gestational hypertension, prior history of cesarean section.   PROCEDURES PERFORMED:  1.  Low transverse cesarean section.  2.  Placement of On-Q pain pump.   SURGEON: Dierdre Searles. Paul Saw Mendenhall, M.D.   ASSISTANT: Vena AustriaAndreas Staebler, M.D.   ANESTHESIA: Spinal.   ESTIMATED BLOOD LOSS: 250 mL.   COMPLICATIONS: None.   FINDINGS: Normal tubes, ovaries, and uterus. Viable female weighing 5 pounds 15 ounces with Apgar scores of 9 and 10.   DISPOSITION: To the recovery room in stable condition.   TECHNIQUE: The patient is prepped and draped in the usual sterile fashion after adequate anesthesia is obtained in the supine position on the operating room table. Scar is noted and a scalpel is used to create a low transverse skin incision through the area of the prior scar down to the level of the rectus fascia. The rectus fascia is dissected bilaterally using Mayo scissors and the rectus muscles are dissected away from the fascia and then separated in the midline. Peritoneum is penetrated and the bladder is inferiorly dissected and retracted. A scalpel is used to create a low transverse hysterotomy incision that is then extended by blunt dissection with amniotomy revealing clear fluid. The head is delivered with suctioning of the oropharynx and reduction of nuchal cord. Remaining portion of the infant is delivered and handed off to the pediatric team.   Cord blood is obtained for cord blood donation and banking. Placenta is then manually extracted and the uterus is externalized and cleansed of all membranes and debris. The hysterotomy incision is closed with a running #1 Vicryl suture in a locking fashion with additional suture placed  obtaining excellent hemostasis. Interceed is placed over the incision to minimize adhesion formation. Uterus is placed back in the intra-abdominal cavity and the paracolic gutters are irrigated with warm saline. Re-examination of the incision reveals excellent hemostasis.   The peritoneum is closed with a Vicryl suture. The On-Q pain pump trocars are placed through the abdomen into the subfascial space and then the Silver soaker catheters are placed without difficulty. The rectus fascia is closed with 0 Maxon suture. Subcutaneous tissues are irrigated and hemostasis is assured using electrocautery. Skin is closed with a 4-0 Vicryl suture in a subcuticular fashion followed by placement of Dermabond. The catheters are flushed with 5 mL each of bupivacaine solution and then stabilized into place. The patient goes to the recovery room in stable condition. All sponge, instrument, and needle counts are correct.    ____________________________ [name], MD rph:sb D: 01/12/2014 08:26:50 ET T: 01/12/2014 08:50:46 ET JOB#: [id number]  cc: Dierdre Searles. Paul Glenda Spelman, MD, <Dictator> Nadara MustardOBERT P Grant Henkes MD ELECTRONICALLY SIGNED 01/14/2014 13:18
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
## 2015-07-10 IMAGING — US US TRANSVAGINAL NON-OB
1 series · 13 of 25 positions shown · non-contrast
Comparison: None

CLINICAL DATA: Patient is postpartum 2.5 months from a C-section.
Mild vaginal bleeding with some clots stent that C-section.

EXAM:
TRANSABDOMINAL AND TRANSVAGINAL ULTRASOUND OF PELVIS
TECHNIQUE: Both transabdominal and transvaginal ultrasound examinations of the
pelvis were performed. Transabdominal technique was performed for
global imaging of the pelvis including uterus, ovaries, adnexal
regions, and pelvic cul-de-sac. It was necessary to proceed with
endovaginal exam following the transabdominal exam to visualize the
endometrium to better advantage.

[Series 1: us transvaginal non-ob · 0.24mm/px · 13 of 60 slices shown]
[im 1/60]
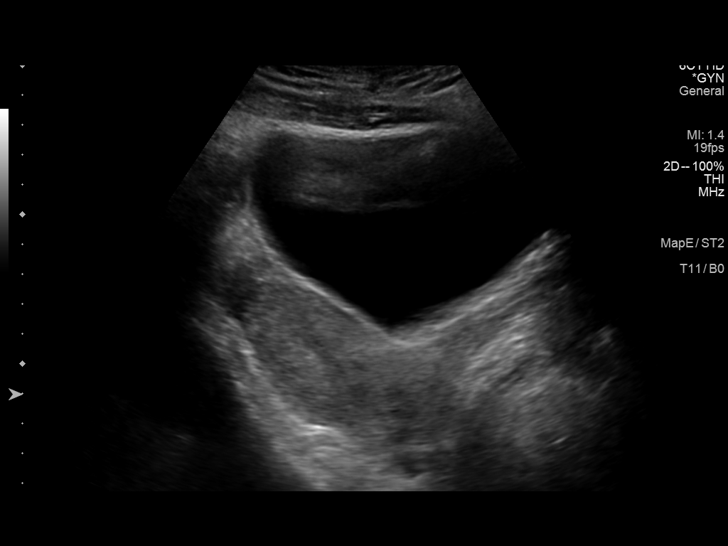
[im 5/60]
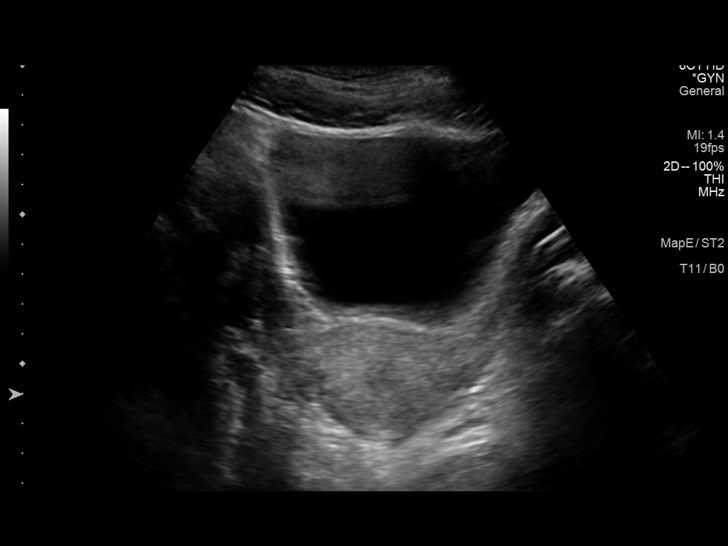
[im 10/60]
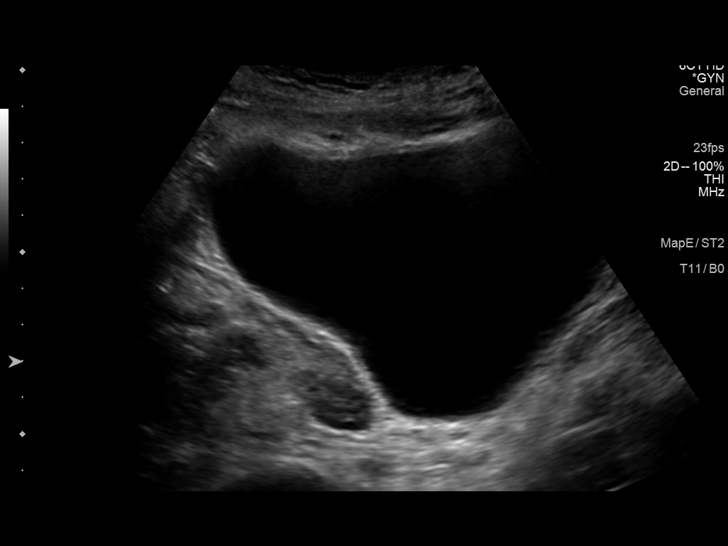
[im 15/60]
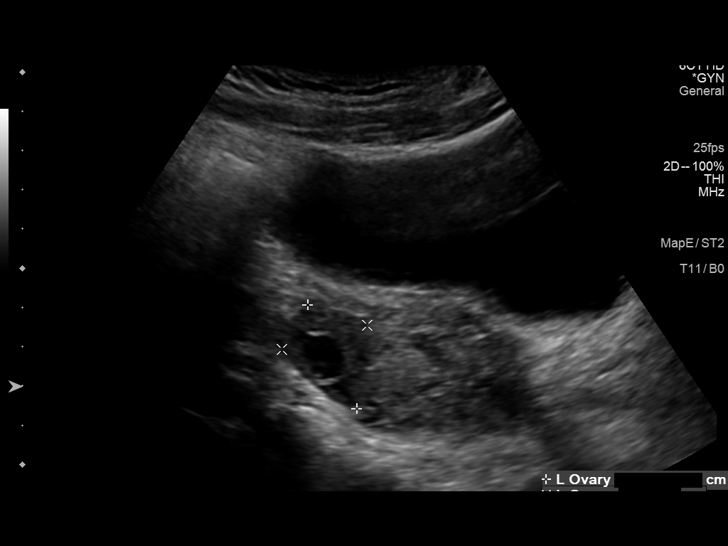
[im 20/60]
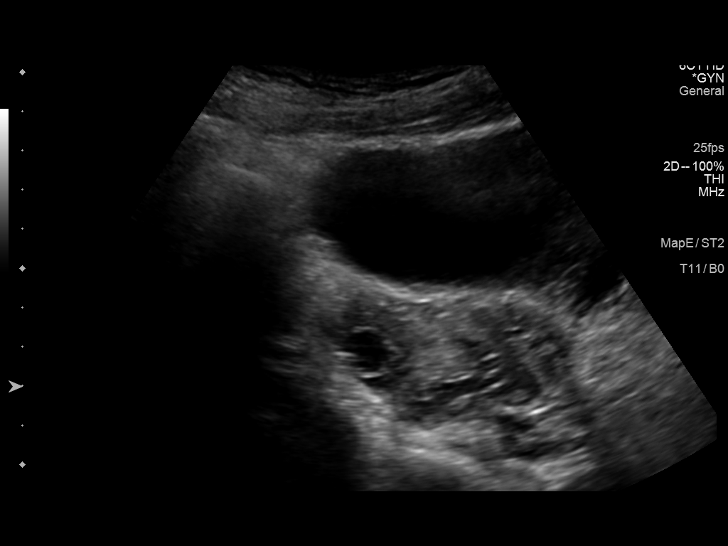
[im 25/60]
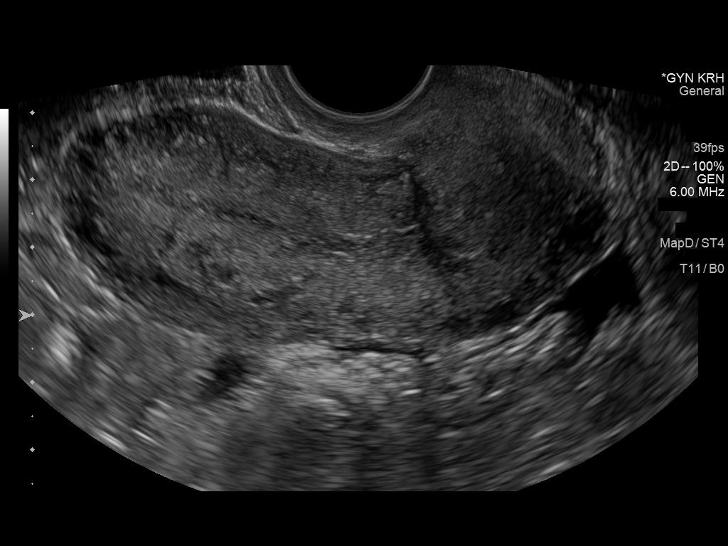
[im 30/60]
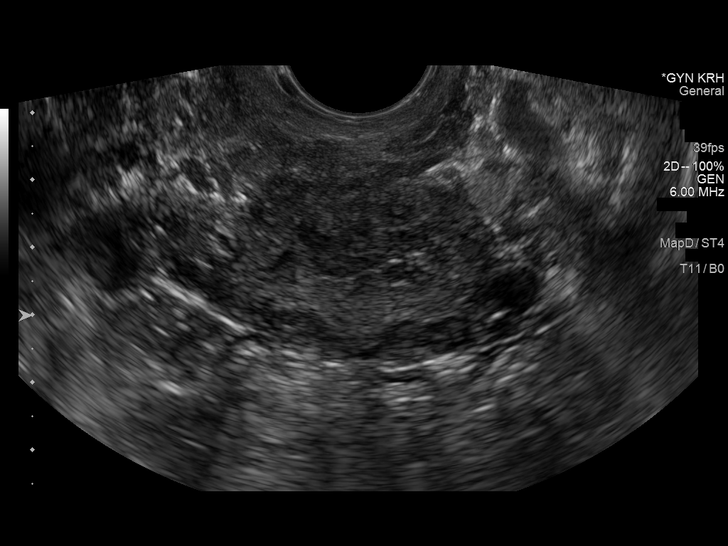
[im 35/60]
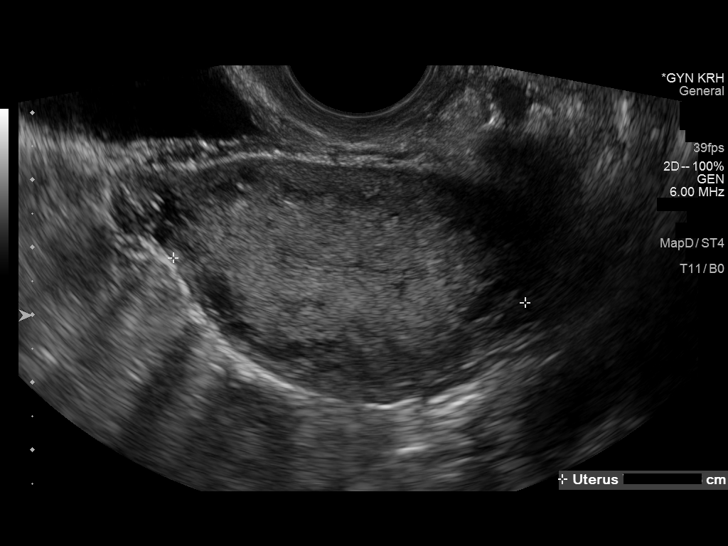
[im 40/60]
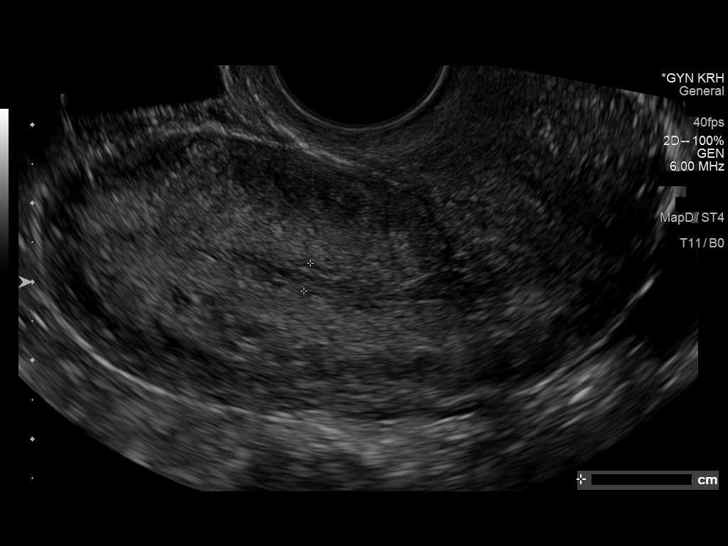
[im 45/60]
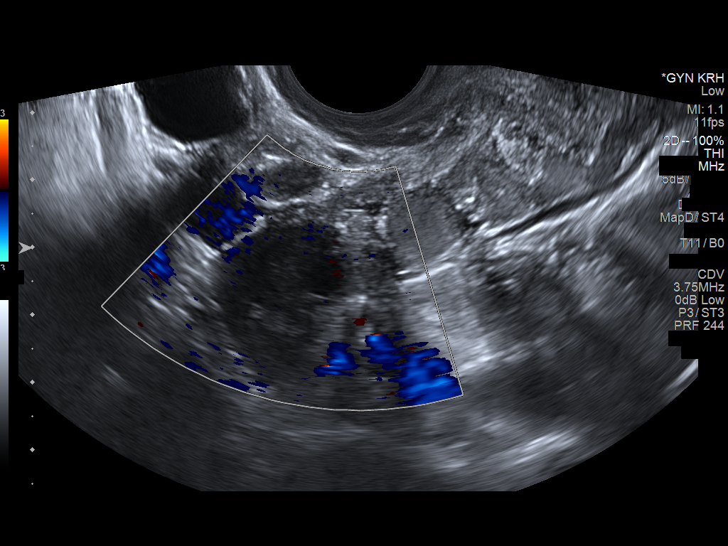
[im 50/60]
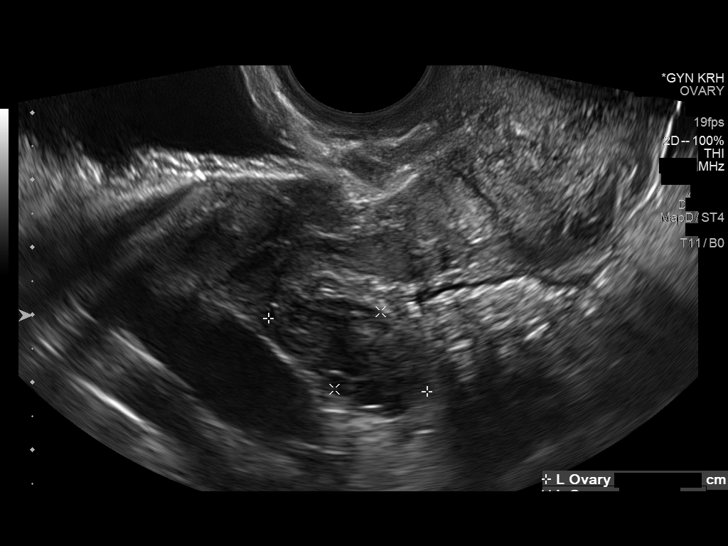
[im 55/60]
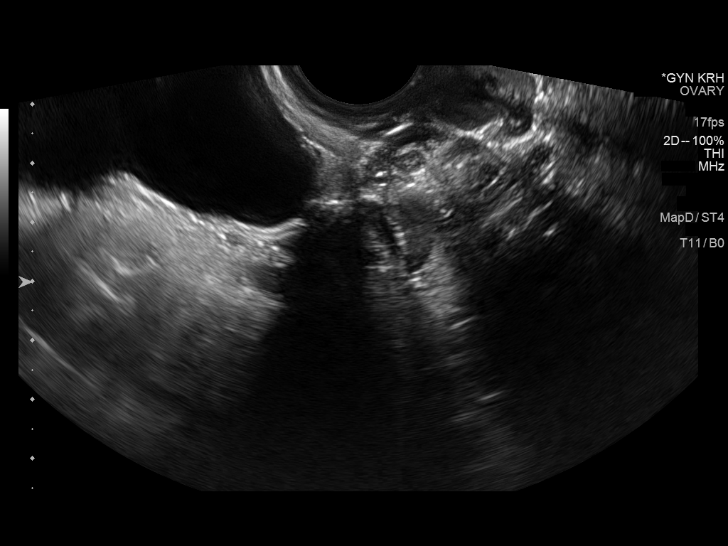
[im 60/60]
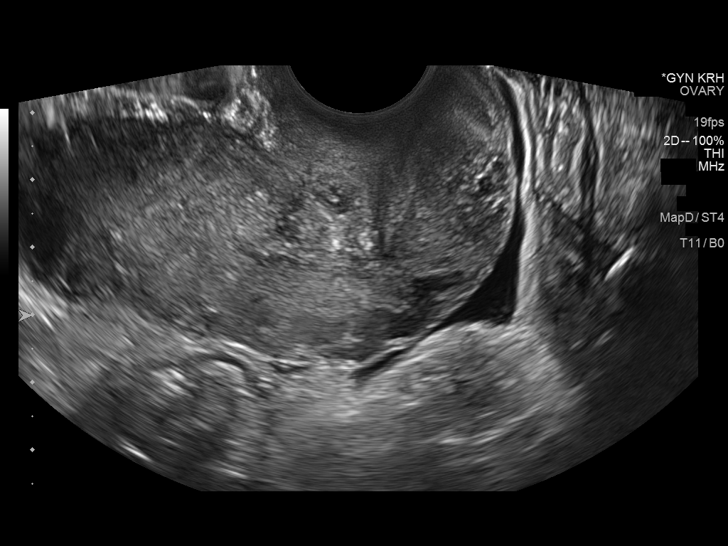

[13 of 25 positions shown; findings below may reference images not displayed]

FINDINGS: Uterus

Measurements: 7.8 cm x 3.3 cm x 5.3 cm. No fibroids or other mass
visualized.

Endometrium

Thickness: 4 mm. Endometrium is heterogeneous in echogenicity.
Questionable small amount of debris or old hemorrhagic products in
the lower endometrial canal. No endometrial mass

Right ovary

Measurements: 2.6 cm x 2.0 cm x 1.9 cm. Normal appearance/no adnexal
mass.

Left ovary

Measurements: 2.6 cm x 1.3 cm x 1.7 cm.. Dominant, 11 mm off
follicular cyst. No adnexal mass.

Other findings

Small amount of free fluid, likely physiologic.
IMPRESSION: 1. Evidence of a minimal amount of debris or chronic products of
hemorrhage in the lower endometrial canal.
2. No a endometrial mass or evidence of retained products of
conception.
3. Exam otherwise unremarkable.

## 2016-03-16 ENCOUNTER — Emergency Department (HOSPITAL_COMMUNITY): Payer: Medicaid Other

## 2016-03-16 ENCOUNTER — Emergency Department (HOSPITAL_COMMUNITY)
Admission: EM | Admit: 2016-03-16 | Discharge: 2016-03-17 | Disposition: A | Discharge status: Home / Self Care | Payer: Medicaid Other | Attending: Emergency Medicine | Admitting: Emergency Medicine

## 2016-03-16 ENCOUNTER — Encounter (HOSPITAL_COMMUNITY): Payer: Self-pay | Admitting: *Deleted

## 2016-03-16 DIAGNOSIS — Z791 Long term (current) use of non-steroidal anti-inflammatories (NSAID): Secondary | ICD-10-CM | POA: Diagnosis not present

## 2016-03-16 DIAGNOSIS — J069 Acute upper respiratory infection, unspecified: Secondary | ICD-10-CM | POA: Diagnosis not present

## 2016-03-16 DIAGNOSIS — F17210 Nicotine dependence, cigarettes, uncomplicated: Secondary | ICD-10-CM | POA: Diagnosis not present

## 2016-03-16 DIAGNOSIS — R05 Cough: Secondary | ICD-10-CM | POA: Diagnosis present

## 2016-03-16 DIAGNOSIS — B9789 Other viral agents as the cause of diseases classified elsewhere: Secondary | ICD-10-CM

## 2016-03-16 LAB — RAPID STREP SCREEN (MED CTR MEBANE ONLY): STREPTOCOCCUS, GROUP A SCREEN (DIRECT): NEGATIVE

## 2016-03-16 MED ORDER — ALBUTEROL SULFATE (2.5 MG/3ML) 0.083% IN NEBU
5.0000 mg | INHALATION_SOLUTION | Freq: Once | RESPIRATORY_TRACT | Status: AC
  Administered 2016-03-16: 5 mg via RESPIRATORY_TRACT
  Filled 2016-03-16: qty 6

## 2016-03-16 MED ORDER — PREDNISONE 20 MG PO TABS
60.0000 mg | ORAL_TABLET | Freq: Once | ORAL | Status: AC
  Administered 2016-03-16: 60 mg via ORAL
  Filled 2016-03-16: qty 3

## 2016-03-16 MED ORDER — ALBUTEROL SULFATE HFA 108 (90 BASE) MCG/ACT IN AERS
2.0000 | INHALATION_SPRAY | Freq: Once | RESPIRATORY_TRACT | Status: AC
  Administered 2016-03-16: 2 via RESPIRATORY_TRACT
  Filled 2016-03-16: qty 6.7

## 2016-03-16 MED ORDER — PREDNISONE 20 MG PO TABS: 40.0000 mg | ORAL_TABLET | Freq: Every day | ORAL | 0 refills | Status: DC

## 2016-03-16 MED ORDER — BENZONATATE 100 MG PO CAPS: 100.0000 mg | ORAL_CAPSULE | Freq: Three times a day (TID) | ORAL | 0 refills | Status: DC

## 2016-03-16 NOTE — Discharge Instructions (Signed)
Using albuterol inhaler, 2 puffs every 4-6 hours, as needed for cough and shortness of breath. Take prednisone as prescribed and Tessalon for cough as needed. Follow-up with your primary care doctor.

## 2016-03-16 NOTE — ED Notes (Signed)
PA at bedside.

## 2016-03-16 NOTE — ED Triage Notes (Signed)
Pt complains of cough, sore throat, shortness of breath, wheezing since yesterday. Pt states she smokes 1 black and mild a day.
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
## 2016-03-19 LAB — CULTURE, GROUP A STREP (THRC)

## 2019-02-26 ENCOUNTER — Emergency Department (HOSPITAL_BASED_OUTPATIENT_CLINIC_OR_DEPARTMENT_OTHER)
Admission: EM | Admit: 2019-02-26 | Discharge: 2019-02-26 | Disposition: A | Discharge status: Home / Self Care | Payer: BLUE CROSS/BLUE SHIELD | Attending: Emergency Medicine | Admitting: Emergency Medicine

## 2019-02-26 ENCOUNTER — Emergency Department (HOSPITAL_BASED_OUTPATIENT_CLINIC_OR_DEPARTMENT_OTHER): Payer: BLUE CROSS/BLUE SHIELD

## 2019-02-26 ENCOUNTER — Other Ambulatory Visit: Payer: Self-pay

## 2019-02-26 ENCOUNTER — Encounter (HOSPITAL_BASED_OUTPATIENT_CLINIC_OR_DEPARTMENT_OTHER): Payer: Self-pay | Admitting: *Deleted

## 2019-02-26 DIAGNOSIS — Z79899 Other long term (current) drug therapy: Secondary | ICD-10-CM | POA: Diagnosis not present

## 2019-02-26 DIAGNOSIS — Z7984 Long term (current) use of oral hypoglycemic drugs: Secondary | ICD-10-CM | POA: Insufficient documentation

## 2019-02-26 DIAGNOSIS — N760 Acute vaginitis: Secondary | ICD-10-CM | POA: Insufficient documentation

## 2019-02-26 DIAGNOSIS — Z91040 Latex allergy status: Secondary | ICD-10-CM | POA: Insufficient documentation

## 2019-02-26 DIAGNOSIS — E119 Type 2 diabetes mellitus without complications: Secondary | ICD-10-CM | POA: Insufficient documentation

## 2019-02-26 DIAGNOSIS — R110 Nausea: Secondary | ICD-10-CM | POA: Diagnosis not present

## 2019-02-26 DIAGNOSIS — R51 Headache: Secondary | ICD-10-CM | POA: Diagnosis not present

## 2019-02-26 DIAGNOSIS — Z87891 Personal history of nicotine dependence: Secondary | ICD-10-CM | POA: Insufficient documentation

## 2019-02-26 DIAGNOSIS — B9689 Other specified bacterial agents as the cause of diseases classified elsewhere: Secondary | ICD-10-CM | POA: Insufficient documentation

## 2019-02-26 DIAGNOSIS — R1084 Generalized abdominal pain: Secondary | ICD-10-CM | POA: Insufficient documentation

## 2019-02-26 LAB — COMPREHENSIVE METABOLIC PANEL
ALT: 54 U/L — ABNORMAL HIGH (ref 0–44)
AST: 29 U/L (ref 15–41)
Albumin: 4.5 g/dL (ref 3.5–5.0)
Alkaline Phosphatase: 60 U/L (ref 38–126)
Anion gap: 10 (ref 5–15)
BUN: 10 mg/dL (ref 6–20)
CO2: 25 mmol/L (ref 22–32)
Calcium: 9.6 mg/dL (ref 8.9–10.3)
Chloride: 101 mmol/L (ref 98–111)
Creatinine, Ser: 0.71 mg/dL (ref 0.44–1.00)
GFR calc Af Amer: >60 mL/min (ref >60)
GFR calc non Af Amer: >60 mL/min (ref >60)
Glucose, Bld: 92 mg/dL (ref 70–99)
Potassium: 3.6 mmol/L (ref 3.5–5.1)
Sodium: 136 mmol/L (ref 135–145)
Total Bilirubin: 0.3 mg/dL (ref 0.3–1.2)
Total Protein: 7.9 g/dL (ref 6.5–8.1)

## 2019-02-26 LAB — CBC WITH DIFFERENTIAL/PLATELET
Abs Immature Granulocytes: 0.02 10*3/uL (ref 0.00–0.07)
Basophils Absolute: 0 10*3/uL (ref 0.0–0.1)
Basophils Relative: 1 %
Eosinophils Absolute: 0.1 10*3/uL (ref 0.0–0.5)
Eosinophils Relative: 1 %
HCT: 39.3 % (ref 36.0–46.0)
Hemoglobin: 12.6 g/dL (ref 12.0–15.0)
Immature Granulocytes: 0 %
Lymphocytes Relative: 41 %
Lymphs Abs: 3.1 10*3/uL (ref 0.7–4.0)
MCH: 29.3 pg (ref 26.0–34.0)
MCHC: 32.1 g/dL (ref 30.0–36.0)
MCV: 91.4 fL (ref 80.0–100.0)
Monocytes Absolute: 0.7 10*3/uL (ref 0.1–1.0)
Monocytes Relative: 9 %
Neutro Abs: 3.7 10*3/uL (ref 1.7–7.7)
Neutrophils Relative %: 48 %
Platelets: 277 10*3/uL (ref 150–400)
RBC: 4.3 MIL/uL (ref 3.87–5.11)
RDW: 12.4 % (ref 11.5–15.5)
WBC: 7.6 10*3/uL (ref 4.0–10.5)
nRBC: 0 % (ref 0.0–0.2)

## 2019-02-26 LAB — URINALYSIS, ROUTINE W REFLEX MICROSCOPIC
Bilirubin Urine: NEGATIVE
Glucose, UA: NEGATIVE mg/dL
Hgb urine dipstick: NEGATIVE
Ketones, ur: NEGATIVE mg/dL
Leukocytes,Ua: NEGATIVE
Nitrite: NEGATIVE
Protein, ur: NEGATIVE mg/dL
Specific Gravity, Urine: 1.01 (ref 1.005–1.030)
pH: 6 (ref 5.0–8.0)

## 2019-02-26 LAB — WET PREP, GENITAL
Sperm: NONE SEEN
Trich, Wet Prep: NONE SEEN
Yeast Wet Prep HPF POC: NONE SEEN

## 2019-02-26 LAB — LIPASE, BLOOD: Lipase: 28 U/L (ref 11–51)

## 2019-02-26 MED ORDER — METRONIDAZOLE 500 MG PO TABS: 500.0000 mg | ORAL_TABLET | Freq: Two times a day (BID) | ORAL | 0 refills | Status: DC

## 2019-02-26 MED ORDER — METRONIDAZOLE 500 MG PO TABS
500.0000 mg | ORAL_TABLET | Freq: Once | ORAL | Status: AC
  Administered 2019-02-26: 500 mg via ORAL
  Filled 2019-02-26: qty 1
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
## 2019-02-26 NOTE — ED Notes (Signed)
Attempted IV access; unable to obtain, but was able to draw blood.

## 2019-02-26 NOTE — ED Notes (Signed)
Patient transported to Ultrasound 

## 2019-02-26 NOTE — Discharge Instructions (Addendum)
It was a pleasure caring for you today.  Take the antibiotic, Flagyl, every 12 hours until gone.  Do not use alcohol while taking this antibiotic, as it can cause significant vomiting. Continue treating your symptoms with over-the-counter medications as needed. Follow-up closely with your primary care provider regarding your visit today. Return to the emergency department if you develop fever, severely worsening abdominal pain, uncontrollable vomiting, if you stop having bowel movements, or new or concerning symptoms.

## 2019-02-26 NOTE — ED Triage Notes (Signed)
Pt sent here from PMD for eval pelvic pain

## 2019-02-27 LAB — GC/CHLAMYDIA PROBE AMP (~~LOC~~) NOT AT ARMC
Chlamydia: NEGATIVE
Neisseria Gonorrhea: NEGATIVE

## 2021-06-25 ENCOUNTER — Emergency Department (HOSPITAL_COMMUNITY)
Admission: EM | Admit: 2021-06-25 | Discharge: 2021-06-26 | Disposition: A | Discharge status: Home / Self Care | Payer: BLUE CROSS/BLUE SHIELD | Attending: Emergency Medicine | Admitting: Emergency Medicine

## 2021-06-25 ENCOUNTER — Encounter (HOSPITAL_COMMUNITY): Payer: Self-pay | Admitting: Student

## 2021-06-25 ENCOUNTER — Other Ambulatory Visit: Payer: Self-pay

## 2021-06-25 DIAGNOSIS — E119 Type 2 diabetes mellitus without complications: Secondary | ICD-10-CM | POA: Insufficient documentation

## 2021-06-25 DIAGNOSIS — I10 Essential (primary) hypertension: Secondary | ICD-10-CM | POA: Diagnosis not present

## 2021-06-25 DIAGNOSIS — Z87891 Personal history of nicotine dependence: Secondary | ICD-10-CM | POA: Insufficient documentation

## 2021-06-25 DIAGNOSIS — H538 Other visual disturbances: Secondary | ICD-10-CM | POA: Diagnosis present

## 2021-06-25 DIAGNOSIS — H5711 Ocular pain, right eye: Secondary | ICD-10-CM | POA: Diagnosis not present

## 2021-06-25 DIAGNOSIS — R531 Weakness: Secondary | ICD-10-CM | POA: Insufficient documentation

## 2021-06-25 DIAGNOSIS — Z79899 Other long term (current) drug therapy: Secondary | ICD-10-CM | POA: Diagnosis not present

## 2021-06-25 DIAGNOSIS — Z20822 Contact with and (suspected) exposure to covid-19: Secondary | ICD-10-CM | POA: Insufficient documentation

## 2021-06-25 DIAGNOSIS — R059 Cough, unspecified: Secondary | ICD-10-CM | POA: Insufficient documentation

## 2021-06-25 DIAGNOSIS — R202 Paresthesia of skin: Secondary | ICD-10-CM

## 2021-06-25 DIAGNOSIS — R197 Diarrhea, unspecified: Secondary | ICD-10-CM | POA: Insufficient documentation

## 2021-06-25 DIAGNOSIS — Z7984 Long term (current) use of oral hypoglycemic drugs: Secondary | ICD-10-CM | POA: Insufficient documentation

## 2021-06-25 DIAGNOSIS — R0981 Nasal congestion: Secondary | ICD-10-CM | POA: Insufficient documentation

## 2021-06-25 LAB — URINALYSIS, ROUTINE W REFLEX MICROSCOPIC
Bilirubin Urine: NEGATIVE
Glucose, UA: >=500 mg/dL — AB
Ketones, ur: >80 mg/dL — AB
Leukocytes,Ua: NEGATIVE
Nitrite: NEGATIVE
Protein, ur: NEGATIVE mg/dL
Specific Gravity, Urine: 1.025 (ref 1.005–1.030)
pH: 5.5 (ref 5.0–8.0)

## 2021-06-25 LAB — I-STAT VENOUS BLOOD GAS, ED
Acid-base deficit: 8 mmol/L — ABNORMAL HIGH (ref 0.0–2.0)
Bicarbonate: 15.8 mmol/L — ABNORMAL LOW (ref 20.0–28.0)
Calcium, Ion: 1.21 mmol/L (ref 1.15–1.40)
HCT: 42 % (ref 36.0–46.0)
Hemoglobin: 14.3 g/dL (ref 12.0–15.0)
O2 Saturation: 96 %
Potassium: 4.7 mmol/L (ref 3.5–5.1)
Sodium: 133 mmol/L — ABNORMAL LOW (ref 135–145)
TCO2: 17 mmol/L — ABNORMAL LOW (ref 22–32)
pCO2, Ven: 27.8 mmHg — ABNORMAL LOW (ref 44.0–60.0)
pH, Ven: 7.362 (ref 7.250–7.430)
pO2, Ven: 80 mmHg — ABNORMAL HIGH (ref 32.0–45.0)

## 2021-06-25 LAB — BASIC METABOLIC PANEL
Anion gap: 13 (ref 5–15)
BUN: 19 mg/dL (ref 6–20)
CO2: 15 mmol/L — ABNORMAL LOW (ref 22–32)
Calcium: 9.9 mg/dL (ref 8.9–10.3)
Chloride: 101 mmol/L (ref 98–111)
Creatinine, Ser: 1.01 mg/dL — ABNORMAL HIGH (ref 0.44–1.00)
GFR, Estimated: >60 mL/min (ref >60)
Glucose, Bld: 433 mg/dL — ABNORMAL HIGH (ref 70–99)
Potassium: 4.6 mmol/L (ref 3.5–5.1)
Sodium: 129 mmol/L — ABNORMAL LOW (ref 135–145)

## 2021-06-25 LAB — I-STAT BETA HCG BLOOD, ED (MC, WL, AP ONLY): I-stat hCG, quantitative: <5 m[IU]/mL (ref <5)

## 2021-06-25 LAB — CBC
HCT: 40.7 % (ref 36.0–46.0)
Hemoglobin: 13.7 g/dL (ref 12.0–15.0)
MCH: 30.6 pg (ref 26.0–34.0)
MCHC: 33.7 g/dL (ref 30.0–36.0)
MCV: 90.8 fL (ref 80.0–100.0)
Platelets: 280 10*3/uL (ref 150–400)
RBC: 4.48 MIL/uL (ref 3.87–5.11)
RDW: 12.8 % (ref 11.5–15.5)
WBC: 8.7 10*3/uL (ref 4.0–10.5)
nRBC: 0 % (ref 0.0–0.2)

## 2021-06-25 LAB — URINALYSIS, MICROSCOPIC (REFLEX): Bacteria, UA: NONE SEEN

## 2021-06-25 LAB — CBG MONITORING, ED: Glucose-Capillary: 430 mg/dL — ABNORMAL HIGH (ref 70–99)

## 2021-06-25 MED ORDER — SODIUM CHLORIDE 0.9 % IV BOLUS
1000.0000 mL | Freq: Once | INTRAVENOUS | Status: AC
  Administered 2021-06-26: 1000 mL via INTRAVENOUS
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
## 2021-06-25 NOTE — ED Triage Notes (Signed)
Pt c/o blurred vision x5 days, states R eye hurts. Polyuria, advises that urine "has bubbles in it," states she feels exhausted & "drained of energy," hx DM & hypertensive episodes, unmedicated for both

## 2021-06-25 NOTE — ED Provider Notes (Signed)
Emergency Medicine Provider Triage Evaluation Note  Taylor Hood , a 35 y.o. female  was evaluated in triage.  Pt complains of bilateral blurred vision with onset 4 to 5 days ago.  Patient states that she woke up and when she put on her glasses, she noticed that everything seems blurry.  She also endorses some right eye pain as well.  No trauma or injury.  She is unable to read even large writing, she describes colors from stoplights is just bright and sparkling.  She said that she was diagnosed as diabetic in March 2020, however afterwards she states that she became hypoglycemic and she was taken off her medications.  She has not been taking diabetes medications for 2 years now.  Review of Systems  Positive: Blurred vision, right eye pain Negative: Fevers, headache, nausea, vomiting, ataxia, weakness  Physical Exam  BP (!) 141/102 (BP Location: Left Arm)    Pulse (!) 112    Temp 99.1 F (37.3 C) (Oral)    Resp 17    SpO2 93%  Gen:   Awake, no distress   Resp:  Normal effort  MSK:   Moves extremities without difficulty  Other:  Visual acuity intact.  She is able to see my fingers and peripheral vision without being able to does not have any.  EOM intact.  Sclera conjunctiva normal.  Medical Decision Making  Medically screening exam initiated at 7:27 PM.  Appropriate orders placed.  Leighton Ruff was informed that the remainder of the evaluation will be completed by another provider, this initial triage assessment does not replace that evaluation, and the importance of remaining in the ED until their evaluation is complete.     Janell Quiet, [place] 06/25/21 1930    Terald Sleeper, MD 06/25/21 2015

## 2021-06-26 ENCOUNTER — Emergency Department (HOSPITAL_COMMUNITY): Payer: BLUE CROSS/BLUE SHIELD

## 2021-06-26 LAB — CBG MONITORING, ED
Glucose-Capillary: 462 mg/dL — ABNORMAL HIGH (ref 70–99)
Glucose-Capillary: 275 mg/dL — ABNORMAL HIGH (ref 70–99)
Glucose-Capillary: 162 mg/dL — ABNORMAL HIGH (ref 70–99)
Glucose-Capillary: 158 mg/dL — ABNORMAL HIGH (ref 70–99)

## 2021-06-26 LAB — HEPATIC FUNCTION PANEL
ALT: 20 U/L (ref 0–44)
AST: 25 U/L (ref 15–41)
Albumin: 4.8 g/dL (ref 3.5–5.0)
Alkaline Phosphatase: 115 U/L (ref 38–126)
Bilirubin, Direct: 0.4 mg/dL — ABNORMAL HIGH (ref 0.0–0.2)
Indirect Bilirubin: 1 mg/dL — ABNORMAL HIGH (ref 0.3–0.9)
Total Bilirubin: 1.4 mg/dL — ABNORMAL HIGH (ref 0.3–1.2)
Total Protein: 8.5 g/dL — ABNORMAL HIGH (ref 6.5–8.1)

## 2021-06-26 LAB — BASIC METABOLIC PANEL
Anion gap: 13 (ref 5–15)
BUN: 13 mg/dL (ref 6–20)
CO2: 14 mmol/L — ABNORMAL LOW (ref 22–32)
Calcium: 9.2 mg/dL (ref 8.9–10.3)
Chloride: 109 mmol/L (ref 98–111)
Creatinine, Ser: 0.77 mg/dL (ref 0.44–1.00)
GFR, Estimated: >60 mL/min (ref >60)
Glucose, Bld: 147 mg/dL — ABNORMAL HIGH (ref 70–99)
Potassium: 4.3 mmol/L (ref 3.5–5.1)
Sodium: 136 mmol/L (ref 135–145)

## 2021-06-26 LAB — RESP PANEL BY RT-PCR (FLU A&B, COVID) ARPGX2
Influenza A by PCR: NEGATIVE
Influenza B by PCR: NEGATIVE
SARS Coronavirus 2 by RT PCR: NEGATIVE

## 2021-06-26 LAB — MAGNESIUM: Magnesium: 2.3 mg/dL (ref 1.7–2.4)

## 2021-06-26 LAB — LIPASE, BLOOD: Lipase: 40 U/L (ref 11–51)

## 2021-06-26 LAB — BETA-HYDROXYBUTYRIC ACID: Beta-Hydroxybutyric Acid: 4.24 mmol/L — ABNORMAL HIGH (ref 0.05–0.27)

## 2021-06-26 LAB — TSH: TSH: 0.78 u[IU]/mL (ref 0.350–4.500)

## 2021-06-26 MED ORDER — DEXTROSE IN LACTATED RINGERS 5 % IV SOLN: INTRAVENOUS | Status: DC

## 2021-06-26 MED ORDER — LACTATED RINGERS IV BOLUS
20.0000 mL/kg | Freq: Once | INTRAVENOUS | Status: AC
  Administered 2021-06-26: 02:00:00 1612 mL via INTRAVENOUS

## 2021-06-26 MED ORDER — POTASSIUM CHLORIDE 10 MEQ/100ML IV SOLN
10.0000 meq | INTRAVENOUS | Status: AC
  Administered 2021-06-26 (×2): 10 meq via INTRAVENOUS
  Filled 2021-06-26 (×2): qty 100

## 2021-06-26 MED ORDER — GADOBUTROL 1 MMOL/ML IV SOLN
8.0000 mL | Freq: Once | INTRAVENOUS | Status: AC | PRN
  Administered 2021-06-26: 06:00:00 8 mL via INTRAVENOUS

## 2021-06-26 MED ORDER — INSULIN REGULAR(HUMAN) IN NACL 100-0.9 UT/100ML-% IV SOLN
INTRAVENOUS | Status: DC
  Administered 2021-06-26: 02:00:00 20 [IU]/h via INTRAVENOUS
  Filled 2021-06-26: qty 100

## 2021-06-26 MED ORDER — DEXTROSE 50 % IV SOLN: 0.0000 mL | INTRAVENOUS | Status: DC | PRN

## 2021-06-26 MED ORDER — METFORMIN HCL 500 MG PO TABS: 500.0000 mg | ORAL_TABLET | Freq: Two times a day (BID) | ORAL | 0 refills | Status: DC

## 2021-06-26 MED ORDER — LACTATED RINGERS IV SOLN: INTRAVENOUS | Status: DC

## 2021-06-26 NOTE — ED Notes (Signed)
Pt just returned from MRI.  

## 2021-06-26 NOTE — Discharge Instructions (Signed)
Contact a health care provider if:  Your symptoms do not improve or they get worse.  You have:  New symptoms.  A headache.  Trouble seeing at night.  Trouble noticing the difference between colors.  You notice:  Drooping of your eyelids.  Drainage coming from your eyes.  A rash around your eyes.  Get help right away if:  You have:  Severe eye pain.  A severe headache.  A sudden change in vision.  A sudden loss of vision.  A vision change after an injury.  You notice flashing lights in your field of vision. Your field of vision is the area that you can see without moving your eyes.

## 2021-06-26 NOTE — ED Notes (Signed)
Per nightshift RN, insulin drip paused before MRI and to keep paused at this time.

## 2021-06-26 NOTE — ED Notes (Signed)
Patient transported to MRI 

## 2021-06-26 NOTE — ED Provider Notes (Signed)
Patient taken in sign out from PA Petrucelli. Here with blurry vision. Hx of untreated diabetes and lupus. She had some vague paresthesia complaints in the extremities- now resolved. Awaiting MRI brain and orbits to r/o MS. Awaiting repeat BMP. Hyperglycemia...borderline DKA- insulin drip on board,    Patient MRI returned with NO abnormalities, Her repeat BMP shows low bicarb- unsure of significance- may represent chronic renal insufficieny- no evidence of DKA- Glucose- markedly improved- discussed findings and return precautions with the patient   Arthor Captain, PA-C 06/28/21 0953    Virgina Norfolk, DO 07/03/21 [phone]

## 2021-06-29 ENCOUNTER — Inpatient Hospital Stay (HOSPITAL_BASED_OUTPATIENT_CLINIC_OR_DEPARTMENT_OTHER)
Admission: EM | Admit: 2021-06-29 | Discharge: 2021-07-04 | DRG: 638 | Disposition: A | Discharge status: Home / Self Care | Payer: BLUE CROSS/BLUE SHIELD | Attending: Internal Medicine | Admitting: Internal Medicine

## 2021-06-29 ENCOUNTER — Other Ambulatory Visit: Payer: Self-pay

## 2021-06-29 ENCOUNTER — Emergency Department (HOSPITAL_BASED_OUTPATIENT_CLINIC_OR_DEPARTMENT_OTHER): Payer: BLUE CROSS/BLUE SHIELD

## 2021-06-29 ENCOUNTER — Encounter (HOSPITAL_BASED_OUTPATIENT_CLINIC_OR_DEPARTMENT_OTHER): Payer: Self-pay

## 2021-06-29 DIAGNOSIS — Z8249 Family history of ischemic heart disease and other diseases of the circulatory system: Secondary | ICD-10-CM

## 2021-06-29 DIAGNOSIS — T383X6A Underdosing of insulin and oral hypoglycemic [antidiabetic] drugs, initial encounter: Secondary | ICD-10-CM | POA: Diagnosis present

## 2021-06-29 DIAGNOSIS — Z833 Family history of diabetes mellitus: Secondary | ICD-10-CM

## 2021-06-29 DIAGNOSIS — Z8616 Personal history of COVID-19: Secondary | ICD-10-CM

## 2021-06-29 DIAGNOSIS — Z91040 Latex allergy status: Secondary | ICD-10-CM

## 2021-06-29 DIAGNOSIS — I10 Essential (primary) hypertension: Secondary | ICD-10-CM | POA: Diagnosis present

## 2021-06-29 DIAGNOSIS — N179 Acute kidney failure, unspecified: Secondary | ICD-10-CM | POA: Diagnosis present

## 2021-06-29 DIAGNOSIS — Z91128 Patient's intentional underdosing of medication regimen for other reason: Secondary | ICD-10-CM

## 2021-06-29 DIAGNOSIS — E1110 Type 2 diabetes mellitus with ketoacidosis without coma: Secondary | ICD-10-CM | POA: Diagnosis not present

## 2021-06-29 DIAGNOSIS — Z8349 Family history of other endocrine, nutritional and metabolic diseases: Secondary | ICD-10-CM

## 2021-06-29 DIAGNOSIS — Z87891 Personal history of nicotine dependence: Secondary | ICD-10-CM

## 2021-06-29 LAB — I-STAT VENOUS BLOOD GAS, ED
Acid-base deficit: 20 mmol/L — ABNORMAL HIGH (ref 0.0–2.0)
Bicarbonate: 8.1 mmol/L — ABNORMAL LOW (ref 20.0–28.0)
Calcium, Ion: 1.31 mmol/L (ref 1.15–1.40)
HCT: 51 % — ABNORMAL HIGH (ref 36.0–46.0)
Hemoglobin: 17.3 g/dL — ABNORMAL HIGH (ref 12.0–15.0)
O2 Saturation: 27 %
Patient temperature: 98.6
Potassium: 5.4 mmol/L — ABNORMAL HIGH (ref 3.5–5.1)
Sodium: 127 mmol/L — ABNORMAL LOW (ref 135–145)
TCO2: 9 mmol/L — ABNORMAL LOW (ref 22–32)
pCO2, Ven: 26.7 mmHg — ABNORMAL LOW (ref 44.0–60.0)
pH, Ven: 7.092 — CL (ref 7.250–7.430)
pO2, Ven: 24 mmHg — CL (ref 32.0–45.0)

## 2021-06-29 LAB — BASIC METABOLIC PANEL
Anion gap: 22 — ABNORMAL HIGH (ref 5–15)
BUN: 36 mg/dL — ABNORMAL HIGH (ref 6–20)
CO2: 7 mmol/L — ABNORMAL LOW (ref 22–32)
Calcium: 9.5 mg/dL (ref 8.9–10.3)
Chloride: 94 mmol/L — ABNORMAL LOW (ref 98–111)
Creatinine, Ser: 1.74 mg/dL — ABNORMAL HIGH (ref 0.44–1.00)
GFR, Estimated: 39 mL/min — ABNORMAL LOW (ref >60)
Glucose, Bld: 651 mg/dL (ref 70–99)
Potassium: 5.4 mmol/L — ABNORMAL HIGH (ref 3.5–5.1)
Sodium: 123 mmol/L — ABNORMAL LOW (ref 135–145)

## 2021-06-29 LAB — HCG, QUANTITATIVE, PREGNANCY: hCG, Beta Chain, Quant, S: <1 m[IU]/mL (ref <5)

## 2021-06-29 LAB — CBG MONITORING, ED
Glucose-Capillary: 598 mg/dL (ref 70–99)
Glucose-Capillary: 545 mg/dL (ref 70–99)
Glucose-Capillary: >600 mg/dL (ref 70–99)

## 2021-06-29 LAB — RESP PANEL BY RT-PCR (FLU A&B, COVID) ARPGX2
Influenza A by PCR: NEGATIVE
Influenza B by PCR: NEGATIVE
SARS Coronavirus 2 by RT PCR: NEGATIVE

## 2021-06-29 LAB — CBC
HCT: 46.9 % — ABNORMAL HIGH (ref 36.0–46.0)
Hemoglobin: 15.5 g/dL — ABNORMAL HIGH (ref 12.0–15.0)
MCH: 30.1 pg (ref 26.0–34.0)
MCHC: 33 g/dL (ref 30.0–36.0)
MCV: 91.1 fL (ref 80.0–100.0)
Platelets: 264 10*3/uL (ref 150–400)
RBC: 5.15 MIL/uL — ABNORMAL HIGH (ref 3.87–5.11)
RDW: 13 % (ref 11.5–15.5)
WBC: 10.7 10*3/uL — ABNORMAL HIGH (ref 4.0–10.5)
nRBC: 0 % (ref 0.0–0.2)

## 2021-06-29 MED ORDER — ONDANSETRON HCL 4 MG/2ML IJ SOLN
4.0000 mg | Freq: Once | INTRAMUSCULAR | Status: AC
  Administered 2021-06-30: 4 mg via INTRAVENOUS
  Filled 2021-06-29: qty 2

## 2021-06-29 MED ORDER — LACTATED RINGERS IV BOLUS
20.0000 mL/kg | Freq: Once | INTRAVENOUS | Status: AC
  Administered 2021-06-29: 23:00:00 1470 mL via INTRAVENOUS

## 2021-06-29 MED ORDER — INSULIN REGULAR(HUMAN) IN NACL 100-0.9 UT/100ML-% IV SOLN
INTRAVENOUS | Status: DC
  Administered 2021-06-29: 23:00:00 11 [IU]/h via INTRAVENOUS
  Filled 2021-06-29 (×2): qty 100

## 2021-06-29 MED ORDER — SODIUM BICARBONATE 8.4 % IV SOLN
50.0000 meq | Freq: Once | INTRAVENOUS | Status: AC
  Administered 2021-06-30: 50 meq via INTRAVENOUS
  Filled 2021-06-29: qty 50

## 2021-06-29 MED ORDER — LACTATED RINGERS IV SOLN: INTRAVENOUS | Status: DC

## 2021-06-29 MED ORDER — DEXTROSE IN LACTATED RINGERS 5 % IV SOLN: INTRAVENOUS | Status: DC

## 2021-06-29 MED ORDER — DEXTROSE 50 % IV SOLN: 0.0000 mL | INTRAVENOUS | Status: DC | PRN

## 2021-06-29 MED ORDER — MORPHINE SULFATE (PF) 4 MG/ML IV SOLN
4.0000 mg | Freq: Once | INTRAVENOUS | Status: AC
  Administered 2021-06-30: 4 mg via INTRAVENOUS
  Filled 2021-06-29: qty 1
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
## 2021-06-29 NOTE — ED Triage Notes (Signed)
Pt c/o body aches, states she feels her BS is elevated-states she does not check BS at home-NAD-to triage in w/c

## 2021-06-29 NOTE — ED Notes (Signed)
Patient transported to CT 

## 2021-06-29 NOTE — ED Notes (Signed)
Unable to obtain urine sample, pt is aware that urine sample is needed.

## 2021-06-30 DIAGNOSIS — Z8349 Family history of other endocrine, nutritional and metabolic diseases: Secondary | ICD-10-CM | POA: Diagnosis not present

## 2021-06-30 DIAGNOSIS — E1110 Type 2 diabetes mellitus with ketoacidosis without coma: Secondary | ICD-10-CM | POA: Diagnosis present

## 2021-06-30 DIAGNOSIS — Z833 Family history of diabetes mellitus: Secondary | ICD-10-CM | POA: Diagnosis not present

## 2021-06-30 DIAGNOSIS — N179 Acute kidney failure, unspecified: Secondary | ICD-10-CM | POA: Diagnosis present

## 2021-06-30 DIAGNOSIS — I10 Essential (primary) hypertension: Secondary | ICD-10-CM | POA: Diagnosis present

## 2021-06-30 DIAGNOSIS — Z8616 Personal history of COVID-19: Secondary | ICD-10-CM | POA: Diagnosis not present

## 2021-06-30 DIAGNOSIS — Z8249 Family history of ischemic heart disease and other diseases of the circulatory system: Secondary | ICD-10-CM | POA: Diagnosis not present

## 2021-06-30 DIAGNOSIS — Z419 Encounter for procedure for purposes other than remedying health state, unspecified: Secondary | ICD-10-CM | POA: Diagnosis not present

## 2021-06-30 DIAGNOSIS — T383X6A Underdosing of insulin and oral hypoglycemic [antidiabetic] drugs, initial encounter: Secondary | ICD-10-CM | POA: Diagnosis present

## 2021-06-30 DIAGNOSIS — Z91128 Patient's intentional underdosing of medication regimen for other reason: Secondary | ICD-10-CM | POA: Diagnosis not present

## 2021-06-30 DIAGNOSIS — Z87891 Personal history of nicotine dependence: Secondary | ICD-10-CM | POA: Diagnosis not present

## 2021-06-30 DIAGNOSIS — Z91040 Latex allergy status: Secondary | ICD-10-CM | POA: Diagnosis not present

## 2021-06-30 LAB — CBG MONITORING, ED
Glucose-Capillary: 204 mg/dL — ABNORMAL HIGH (ref 70–99)
Glucose-Capillary: 206 mg/dL — ABNORMAL HIGH (ref 70–99)
Glucose-Capillary: 235 mg/dL — ABNORMAL HIGH (ref 70–99)
Glucose-Capillary: 243 mg/dL — ABNORMAL HIGH (ref 70–99)
Glucose-Capillary: 252 mg/dL — ABNORMAL HIGH (ref 70–99)
Glucose-Capillary: 266 mg/dL — ABNORMAL HIGH (ref 70–99)
Glucose-Capillary: 311 mg/dL — ABNORMAL HIGH (ref 70–99)
Glucose-Capillary: 380 mg/dL — ABNORMAL HIGH (ref 70–99)
Glucose-Capillary: 436 mg/dL — ABNORMAL HIGH (ref 70–99)
Glucose-Capillary: 519 mg/dL (ref 70–99)

## 2021-06-30 LAB — BASIC METABOLIC PANEL
Anion gap: 18 — ABNORMAL HIGH (ref 5–15)
Anion gap: 11 (ref 5–15)
Anion gap: 9 (ref 5–15)
Anion gap: 5 (ref 5–15)
Anion gap: 8 (ref 5–15)
BUN: 31 mg/dL — ABNORMAL HIGH (ref 6–20)
BUN: 24 mg/dL — ABNORMAL HIGH (ref 6–20)
BUN: 19 mg/dL (ref 6–20)
BUN: 17 mg/dL (ref 6–20)
BUN: 15 mg/dL (ref 6–20)
CO2: 10 mmol/L — ABNORMAL LOW (ref 22–32)
CO2: 16 mmol/L — ABNORMAL LOW (ref 22–32)
CO2: 15 mmol/L — ABNORMAL LOW (ref 22–32)
CO2: 19 mmol/L — ABNORMAL LOW (ref 22–32)
CO2: 20 mmol/L — ABNORMAL LOW (ref 22–32)
Calcium: 8.7 mg/dL — ABNORMAL LOW (ref 8.9–10.3)
Calcium: 9.1 mg/dL (ref 8.9–10.3)
Calcium: 8.9 mg/dL (ref 8.9–10.3)
Calcium: 9 mg/dL (ref 8.9–10.3)
Calcium: 9 mg/dL (ref 8.9–10.3)
Chloride: 102 mmol/L (ref 98–111)
Chloride: 105 mmol/L (ref 98–111)
Chloride: 106 mmol/L (ref 98–111)
Chloride: 108 mmol/L (ref 98–111)
Chloride: 105 mmol/L (ref 98–111)
Creatinine, Ser: 1.32 mg/dL — ABNORMAL HIGH (ref 0.44–1.00)
Creatinine, Ser: 1.03 mg/dL — ABNORMAL HIGH (ref 0.44–1.00)
Creatinine, Ser: 0.83 mg/dL (ref 0.44–1.00)
Creatinine, Ser: 0.69 mg/dL (ref 0.44–1.00)
Creatinine, Ser: 0.71 mg/dL (ref 0.44–1.00)
GFR, Estimated: 54 mL/min — ABNORMAL LOW (ref >60)
GFR, Estimated: >60 mL/min (ref >60)
GFR, Estimated: >60 mL/min (ref >60)
GFR, Estimated: >60 mL/min (ref >60)
GFR, Estimated: >60 mL/min (ref >60)
Glucose, Bld: 422 mg/dL — ABNORMAL HIGH (ref 70–99)
Glucose, Bld: 208 mg/dL — ABNORMAL HIGH (ref 70–99)
Glucose, Bld: 246 mg/dL — ABNORMAL HIGH (ref 70–99)
Glucose, Bld: 172 mg/dL — ABNORMAL HIGH (ref 70–99)
Glucose, Bld: 163 mg/dL — ABNORMAL HIGH (ref 70–99)
Potassium: 3.9 mmol/L (ref 3.5–5.1)
Potassium: 3.8 mmol/L (ref 3.5–5.1)
Potassium: 3.3 mmol/L — ABNORMAL LOW (ref 3.5–5.1)
Potassium: 2.9 mmol/L — ABNORMAL LOW (ref 3.5–5.1)
Potassium: 3.1 mmol/L — ABNORMAL LOW (ref 3.5–5.1)
Sodium: 130 mmol/L — ABNORMAL LOW (ref 135–145)
Sodium: 132 mmol/L — ABNORMAL LOW (ref 135–145)
Sodium: 130 mmol/L — ABNORMAL LOW (ref 135–145)
Sodium: 132 mmol/L — ABNORMAL LOW (ref 135–145)
Sodium: 133 mmol/L — ABNORMAL LOW (ref 135–145)

## 2021-06-30 LAB — URINALYSIS, MICROSCOPIC (REFLEX)

## 2021-06-30 LAB — URINALYSIS, ROUTINE W REFLEX MICROSCOPIC
Bilirubin Urine: NEGATIVE
Glucose, UA: >=500 mg/dL — AB
Ketones, ur: >=80 mg/dL — AB
Leukocytes,Ua: NEGATIVE
Nitrite: NEGATIVE
Protein, ur: 100 mg/dL — AB
Specific Gravity, Urine: 1.025 (ref 1.005–1.030)
pH: 5.5 (ref 5.0–8.0)

## 2021-06-30 LAB — GLUCOSE, CAPILLARY
Glucose-Capillary: 227 mg/dL — ABNORMAL HIGH (ref 70–99)
Glucose-Capillary: 234 mg/dL — ABNORMAL HIGH (ref 70–99)
Glucose-Capillary: 160 mg/dL — ABNORMAL HIGH (ref 70–99)
Glucose-Capillary: 179 mg/dL — ABNORMAL HIGH (ref 70–99)
Glucose-Capillary: 179 mg/dL — ABNORMAL HIGH (ref 70–99)
Glucose-Capillary: 150 mg/dL — ABNORMAL HIGH (ref 70–99)
Glucose-Capillary: 335 mg/dL — ABNORMAL HIGH (ref 70–99)

## 2021-06-30 LAB — BETA-HYDROXYBUTYRIC ACID
Beta-Hydroxybutyric Acid: >8 mmol/L — ABNORMAL HIGH (ref 0.05–0.27)
Beta-Hydroxybutyric Acid: 3.23 mmol/L — ABNORMAL HIGH (ref 0.05–0.27)
Beta-Hydroxybutyric Acid: 0.47 mmol/L — ABNORMAL HIGH (ref 0.05–0.27)

## 2021-06-30 LAB — MRSA NEXT GEN BY PCR, NASAL: MRSA by PCR Next Gen: NOT DETECTED

## 2021-06-30 MED ORDER — LACTATED RINGERS IV SOLN: INTRAVENOUS | Status: DC

## 2021-06-30 MED ORDER — POTASSIUM CHLORIDE 10 MEQ/100ML IV SOLN
10.0000 meq | INTRAVENOUS | Status: AC
  Administered 2021-06-30 (×2): 10 meq via INTRAVENOUS
  Filled 2021-06-30 (×2): qty 100

## 2021-06-30 MED ORDER — LACTATED RINGERS IV BOLUS: 20.0000 mL/kg | Freq: Once | INTRAVENOUS | Status: AC

## 2021-06-30 MED ORDER — ORAL CARE MOUTH RINSE
15.0000 mL | Freq: Two times a day (BID) | OROMUCOSAL | Status: DC
  Administered 2021-06-30 – 2021-07-04 (×9): 15 mL via OROMUCOSAL

## 2021-06-30 MED ORDER — DEXTROSE 50 % IV SOLN: 0.0000 mL | INTRAVENOUS | Status: DC | PRN

## 2021-06-30 MED ORDER — ONDANSETRON HCL 4 MG/2ML IJ SOLN: 4.0000 mg | Freq: Four times a day (QID) | INTRAMUSCULAR | Status: DC | PRN

## 2021-06-30 MED ORDER — DEXTROSE IN LACTATED RINGERS 5 % IV SOLN: INTRAVENOUS | Status: DC

## 2021-06-30 MED ORDER — INSULIN REGULAR(HUMAN) IN NACL 100-0.9 UT/100ML-% IV SOLN: INTRAVENOUS | Status: DC

## 2021-06-30 MED ORDER — INSULIN ASPART 100 UNIT/ML IJ SOLN: 0.0000 [IU] | Freq: Three times a day (TID) | INTRAMUSCULAR | Status: DC

## 2021-06-30 MED ORDER — INSULIN GLARGINE-YFGN 100 UNIT/ML ~~LOC~~ SOLN
8.0000 [IU] | Freq: Every day | SUBCUTANEOUS | Status: DC
  Administered 2021-06-30 – 2021-07-01 (×2): 8 [IU] via SUBCUTANEOUS
  Filled 2021-06-30 (×2): qty 0.08

## 2021-06-30 MED ORDER — CHLORHEXIDINE GLUCONATE CLOTH 2 % EX PADS
6.0000 | MEDICATED_PAD | Freq: Every day | CUTANEOUS | Status: DC
  Administered 2021-06-30 – 2021-07-04 (×5): 6 via TOPICAL

## 2021-06-30 NOTE — ED Notes (Signed)
Pt asking for something to eat.  Referred to EDP.  Clear liquids allowed
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
## 2021-06-30 NOTE — ED Notes (Signed)
Checked CBG 204 RN Alfred informed

## 2021-06-30 NOTE — ED Notes (Signed)
Report given to receiving RN at [facility].  [facility] at bedside for transport.  Pt. Condition stable.

## 2021-06-30 NOTE — Progress Notes (Signed)
Inpatient Diabetes Program Recommendations  AACE/ADA: New Consensus Statement on Inpatient Glycemic Control (2015)  Target Ranges:  Prepandial:   less than 140 mg/dL      Peak postprandial:   less than 180 mg/dL (1-2 hours)      Critically ill patients:  140 - 180 mg/dL   Lab Results  Component Value Date   GLUCAP 160 (H) 06/30/2021    Review of Glycemic Control  Diabetes history: DM2 Outpatient Diabetes medications: metformin 500 BID (did not get prescription filled on 06/25/21) Current orders for Inpatient glycemic control: IV insulin transitioning to Semglee 8 units QD  Inpatient Diabetes Program Recommendations:    Need updated HgbA1C  Consider Novolog 0-9 units TID with meals and 0-5 HS Novolog 2 units TID if eating > 50%  Pt's insurance has denied approval for Libre CGM. Needs prescription for blood glucose meter (#[id number]) and insulin pens, along with pen needles (#[id number])  Would likely benefit from OP Diabetes Education consult  Will follow.  Thank you. Ailene Ards, RD, LDN, CDE Inpatient Diabetes Coordinator [phone]

## 2021-07-01 LAB — BASIC METABOLIC PANEL
Anion gap: 11 (ref 5–15)
Anion gap: 15 (ref 5–15)
Anion gap: 10 (ref 5–15)
Anion gap: 8 (ref 5–15)
Anion gap: 7 (ref 5–15)
Anion gap: 7 (ref 5–15)
BUN: 16 mg/dL (ref 6–20)
BUN: 16 mg/dL (ref 6–20)
BUN: 15 mg/dL (ref 6–20)
BUN: 15 mg/dL (ref 6–20)
BUN: 11 mg/dL (ref 6–20)
BUN: 10 mg/dL (ref 6–20)
CO2: 16 mmol/L — ABNORMAL LOW (ref 22–32)
CO2: 13 mmol/L — ABNORMAL LOW (ref 22–32)
CO2: 16 mmol/L — ABNORMAL LOW (ref 22–32)
CO2: 15 mmol/L — ABNORMAL LOW (ref 22–32)
CO2: 22 mmol/L (ref 22–32)
CO2: 23 mmol/L (ref 22–32)
Calcium: 8.8 mg/dL — ABNORMAL LOW (ref 8.9–10.3)
Calcium: 8.6 mg/dL — ABNORMAL LOW (ref 8.9–10.3)
Calcium: 9.1 mg/dL (ref 8.9–10.3)
Calcium: 8.7 mg/dL — ABNORMAL LOW (ref 8.9–10.3)
Calcium: 8.8 mg/dL — ABNORMAL LOW (ref 8.9–10.3)
Calcium: 8.9 mg/dL (ref 8.9–10.3)
Chloride: 102 mmol/L (ref 98–111)
Chloride: 100 mmol/L (ref 98–111)
Chloride: 104 mmol/L (ref 98–111)
Chloride: 109 mmol/L (ref 98–111)
Chloride: 107 mmol/L (ref 98–111)
Chloride: 106 mmol/L (ref 98–111)
Creatinine, Ser: 0.8 mg/dL (ref 0.44–1.00)
Creatinine, Ser: 0.95 mg/dL (ref 0.44–1.00)
Creatinine, Ser: 0.83 mg/dL (ref 0.44–1.00)
Creatinine, Ser: 0.59 mg/dL (ref 0.44–1.00)
Creatinine, Ser: 0.63 mg/dL (ref 0.44–1.00)
Creatinine, Ser: 0.61 mg/dL (ref 0.44–1.00)
GFR, Estimated: >60 mL/min (ref >60)
GFR, Estimated: >60 mL/min (ref >60)
GFR, Estimated: >60 mL/min (ref >60)
GFR, Estimated: >60 mL/min (ref >60)
GFR, Estimated: >60 mL/min (ref >60)
GFR, Estimated: >60 mL/min (ref >60)
Glucose, Bld: 412 mg/dL — ABNORMAL HIGH (ref 70–99)
Glucose, Bld: 578 mg/dL (ref 70–99)
Glucose, Bld: 419 mg/dL — ABNORMAL HIGH (ref 70–99)
Glucose, Bld: 251 mg/dL — ABNORMAL HIGH (ref 70–99)
Glucose, Bld: 165 mg/dL — ABNORMAL HIGH (ref 70–99)
Glucose, Bld: 122 mg/dL — ABNORMAL HIGH (ref 70–99)
Potassium: 3.3 mmol/L — ABNORMAL LOW (ref 3.5–5.1)
Potassium: 3.5 mmol/L (ref 3.5–5.1)
Potassium: 3 mmol/L — ABNORMAL LOW (ref 3.5–5.1)
Potassium: 3.1 mmol/L — ABNORMAL LOW (ref 3.5–5.1)
Potassium: 2.9 mmol/L — ABNORMAL LOW (ref 3.5–5.1)
Potassium: 2.6 mmol/L — CL (ref 3.5–5.1)
Sodium: 129 mmol/L — ABNORMAL LOW (ref 135–145)
Sodium: 128 mmol/L — ABNORMAL LOW (ref 135–145)
Sodium: 130 mmol/L — ABNORMAL LOW (ref 135–145)
Sodium: 132 mmol/L — ABNORMAL LOW (ref 135–145)
Sodium: 136 mmol/L (ref 135–145)
Sodium: 136 mmol/L (ref 135–145)

## 2021-07-01 LAB — GLUCOSE, CAPILLARY
Glucose-Capillary: 524 mg/dL (ref 70–99)
Glucose-Capillary: 422 mg/dL — ABNORMAL HIGH (ref 70–99)
Glucose-Capillary: 421 mg/dL — ABNORMAL HIGH (ref 70–99)
Glucose-Capillary: 249 mg/dL — ABNORMAL HIGH (ref 70–99)
Glucose-Capillary: 347 mg/dL — ABNORMAL HIGH (ref 70–99)
Glucose-Capillary: 190 mg/dL — ABNORMAL HIGH (ref 70–99)
Glucose-Capillary: 185 mg/dL — ABNORMAL HIGH (ref 70–99)
Glucose-Capillary: 176 mg/dL — ABNORMAL HIGH (ref 70–99)
Glucose-Capillary: 181 mg/dL — ABNORMAL HIGH (ref 70–99)
Glucose-Capillary: 182 mg/dL — ABNORMAL HIGH (ref 70–99)
Glucose-Capillary: 160 mg/dL — ABNORMAL HIGH (ref 70–99)
Glucose-Capillary: 141 mg/dL — ABNORMAL HIGH (ref 70–99)
Glucose-Capillary: 118 mg/dL — ABNORMAL HIGH (ref 70–99)

## 2021-07-01 LAB — BLOOD GAS, VENOUS
Acid-base deficit: 5.3 mmol/L — ABNORMAL HIGH (ref 0.0–2.0)
Bicarbonate: 17.6 mmol/L — ABNORMAL LOW (ref 20.0–28.0)
O2 Saturation: 96.2 %
Patient temperature: 98.6
pCO2, Ven: 28 mmHg — ABNORMAL LOW (ref 44.0–60.0)
pH, Ven: 7.415 (ref 7.250–7.430)
pO2, Ven: 78 mmHg — ABNORMAL HIGH (ref 32.0–45.0)

## 2021-07-01 LAB — HEMOGLOBIN A1C
Hgb A1c MFr Bld: 10.2 % — ABNORMAL HIGH (ref 4.8–5.6)
Mean Plasma Glucose: 246.04 mg/dL

## 2021-07-01 LAB — BETA-HYDROXYBUTYRIC ACID
Beta-Hydroxybutyric Acid: 4 mmol/L — ABNORMAL HIGH (ref 0.05–0.27)
Beta-Hydroxybutyric Acid: 2.44 mmol/L — ABNORMAL HIGH (ref 0.05–0.27)
Beta-Hydroxybutyric Acid: 0.99 mmol/L — ABNORMAL HIGH (ref 0.05–0.27)

## 2021-07-01 MED ORDER — INSULIN ASPART 100 UNIT/ML IJ SOLN
2.0000 [IU] | Freq: Three times a day (TID) | INTRAMUSCULAR | Status: DC
  Administered 2021-07-01: 2 [IU] via SUBCUTANEOUS

## 2021-07-01 MED ORDER — INSULIN ASPART 100 UNIT/ML IJ SOLN: 0.0000 [IU] | Freq: Every day | INTRAMUSCULAR | Status: DC

## 2021-07-01 MED ORDER — ENOXAPARIN SODIUM 40 MG/0.4ML IJ SOSY
40.0000 mg | PREFILLED_SYRINGE | INTRAMUSCULAR | Status: DC
  Administered 2021-07-02 – 2021-07-04 (×3): 40 mg via SUBCUTANEOUS
  Filled 2021-07-01 (×3): qty 0.4

## 2021-07-01 MED ORDER — INSULIN ASPART 100 UNIT/ML IJ SOLN: 0.0000 [IU] | Freq: Three times a day (TID) | INTRAMUSCULAR | Status: DC

## 2021-07-01 MED ORDER — DEXTROSE IN LACTATED RINGERS 5 % IV SOLN: INTRAVENOUS | Status: DC

## 2021-07-01 MED ORDER — INSULIN ASPART 100 UNIT/ML IJ SOLN
0.0000 [IU] | Freq: Three times a day (TID) | INTRAMUSCULAR | Status: DC
  Administered 2021-07-01: 15 [IU] via SUBCUTANEOUS

## 2021-07-01 MED ORDER — INSULIN ASPART 100 UNIT/ML IJ SOLN
10.0000 [IU] | Freq: Once | INTRAMUSCULAR | Status: AC
Start: 2021-07-01 — End: 2021-07-01
  Administered 2021-07-01: 10 [IU] via SUBCUTANEOUS

## 2021-07-01 MED ORDER — ALUM & MAG HYDROXIDE-SIMETH 200-200-20 MG/5ML PO SUSP
30.0000 mL | Freq: Four times a day (QID) | ORAL | Status: DC | PRN
  Administered 2021-07-01: 30 mL via ORAL
  Filled 2021-07-01: qty 30

## 2021-07-01 MED ORDER — ENOXAPARIN SODIUM 30 MG/0.3ML IJ SOSY
30.0000 mg | PREFILLED_SYRINGE | Freq: Two times a day (BID) | INTRAMUSCULAR | Status: DC
  Administered 2021-07-01: 30 mg via SUBCUTANEOUS
  Filled 2021-07-01: qty 0.3

## 2021-07-01 MED ORDER — POTASSIUM CHLORIDE CRYS ER 20 MEQ PO TBCR
40.0000 meq | EXTENDED_RELEASE_TABLET | Freq: Two times a day (BID) | ORAL | Status: AC
  Administered 2021-07-01 – 2021-07-02 (×2): 40 meq via ORAL
  Filled 2021-07-01 (×2): qty 2

## 2021-07-01 MED ORDER — LACTATED RINGERS IV SOLN: INTRAVENOUS | Status: DC

## 2021-07-01 MED ORDER — POTASSIUM CHLORIDE 10 MEQ/100ML IV SOLN
10.0000 meq | INTRAVENOUS | Status: AC
  Administered 2021-07-01 (×2): 10 meq via INTRAVENOUS

## 2021-07-01 MED ORDER — LACTATED RINGERS IV BOLUS
20.0000 mL/kg | Freq: Once | INTRAVENOUS | Status: AC
  Administered 2021-07-01: 1000 mL via INTRAVENOUS

## 2021-07-01 MED ORDER — DEXTROSE 50 % IV SOLN: 0.0000 mL | INTRAVENOUS | Status: DC | PRN

## 2021-07-01 MED ORDER — INSULIN REGULAR(HUMAN) IN NACL 100-0.9 UT/100ML-% IV SOLN
INTRAVENOUS | Status: DC
  Administered 2021-07-01: 15 [IU]/h via INTRAVENOUS
  Administered 2021-07-02: 4.8 [IU]/h via INTRAVENOUS
  Filled 2021-07-01 (×2): qty 100

## 2021-07-01 NOTE — Progress Notes (Signed)
Inpatient Diabetes Program Recommendations  AACE/ADA: New Consensus Statement on Inpatient Glycemic Control (2015)  Target Ranges:  Prepandial:   less than 140 mg/dL      Peak postprandial:   less than 180 mg/dL (1-2 hours)      Critically ill patients:  140 - 180 mg/dL   Lab Results  Component Value Date   GLUCAP 422 (H) 07/01/2021   HGBA1C 10.2 (H) 06/30/2021    Review of Glycemic Control  Latest Reference Range & Units 06/30/21 23:06 07/01/21 04:39 07/01/21 07:51  Glucose-Capillary 70 - 99 mg/dL 335 (H) 524 (HH) 422 (H)  (HH): Data is critically high Diabetes history: DM2 Outpatient Diabetes medications: metformin 500 BID (did not get prescription filled on 06/25/21) Current orders for Inpatient glycemic control: IV insulin transitioning to Semglee 8 units QD  Inpatient Diabetes Program Recommendations:    Consider adding beta hydroxybutyric acid, if indicative of acidosis would consider restarting IV insulin.  Anticipate increase insulin needs as during transition patient's average was >5 units/hr. Basal insulin given at same time as IV insulin stopped. Will plan to reach out to patient.   Spoke with patient regarding outpatient diabetes management. Patient was diagnosed with type 2 dm in march of 2020 and used insulin pens. Reports that insulin was discontinued shortly after due to hypoglycemia. During pregnancy, patient continued to experience hypoglycemia, thus did not require insulin. Is followed by Dr Posey Pronto for diabetes and adrenal insufficiency; is no longer taking Cortef because she felt like it made her diabetes worse.  Reviewed patient's current A1c of 10.2%. Explained what a A1c is and what it measures. Also reviewed goal A1c with patient, importance of good glucose control @ home, and blood sugar goals. Reviewed patho of DM, survival skills, interventions, hypo vs hyperglycemia, current inpatient glucose needs, impact of discontinuation of breastfeeding on glucose  metabolism, vascular changes, concern for lack of treatment for adrenal insufficiency, importance of monitoring blood sugars, impact of steroids, when to call MD and other commorbidities. Patient has several freestyle libres at home. Encouraged to place one on when she is discharged. In addition, patient needs glucose meter as back up. Glucose meter kit [phone]). Patient is scheduled to follow up with PCP on Tuesday and has a follow up with Dr patel. Encouraged to reach out via mychart with hyper/hypo glycemia.  Admits to consuming sugary beverages, but has stopped since 12/25 when prescribed Metformin. Encouraged to continue to be mindful with CHO intake.  Discussed plan of care with Dr Rodena Piety. Will follow beta results. Discussed with patient. Has no further questions at this time.     Thanks, Bronson Curb, MSN, RNC-OB Diabetes Coordinator [phone] (8a-5p)

## 2021-07-01 NOTE — Progress Notes (Signed)
PROGRESS NOTE    [name]  IHK:[id number] DOB: 12/22/1985 DOA: 06/29/2021 PCP: Jackie Plum, MD   Brief Narrative:  PEGGIE Hood is a 35 y.o. female with medical history significant of type 2 diabetes, lupus admitted with nausea and vomiting and abdominal pain. She reports she was diagnosed with type 2 diabetes in 2020 was started on insulin and metformin.  However these were stopped due to hypoglycemia 4 months later. She was in the ER on December 25 with blurry vision and she was discharged on metformin which she never picked up apparently she reports she did not have any diabetic supplies to check at home however she did not pick up her prescriptions for metformin.  MRI of the brain at that time showed no acute abnormalities. She has occasional cough but no fever chills dysuria chest pain or shortness of breath.  She had COVID in 2020.  She had not taken any vaccines for COVID. She states that she has a rheumatologist that she follows up for lupus.  However she was not started on any medications since she was breast-feeding her 56-year-old baby.   ED Course: She was started on insulin drip. ABG 7.0 03/28/2023 sodium 127 potassium 5.4 Her anion gap on admission was 18 BUN 31 creatinine 1.32 potassium 3.9 sodium 130.  Assessment & Plan:   Principal Problem:   DKA (diabetic ketoacidosis) (HCC)   #1 DKA due to noncompliance.  No evidence of signs and symptoms of infection.  Urine positive for ketones.  Anion gap on admission was 18. Her anion gap is starting to trend up with elevated beta hydroxybutyric acid we will restart her on insulin drip.  Appreciate diabetic coordinator input. Check hemoglobin A1c this admission was 10.2. Continue insulin drip per Endo tool protocol   #2 history of lupus follow-up with rheumatology  #3 history of adrenal insufficiency not on any treatments prior to admission.  Estimated body mass index is 26.08 kg/m as calculated from the  following:   Height as of this encounter: 5\' 6"  (1.676 m).   Weight as of this encounter: 73.3 kg.  DVT prophylaxis: Lovenox Code Status: full Family Communication: None at bedside Disposition Plan:  Status is: Inpatient  Remains inpatient appropriate because: DKA   Consultants:  None  Procedures: None Antimicrobials: None  Subjective: Patient is resting in bed  Resting tachycardic Heart rate up to 150s with exertion Still has nausea Blood sugar over 400 this morning  Objective: Vitals:   07/01/21 0400 07/01/21 0500 07/01/21 0800 07/01/21 0900  BP: 119/80 136/74 (!) 133/92 (!) 166/96  Pulse: 100 95 99 (!) 117  Resp: 16 20 20  (!) 28  Temp:   98 F (36.7 C)   TempSrc:   Oral   SpO2: 95% 99% 100% 98%  Weight:      Height:        Intake/Output Summary (Last 24 hours) at 07/01/2021 1142 Last data filed at 07/01/2021 0500 Gross per 24 hour  Intake 2476.19 ml  Output --  Net 2476.19 ml   Filed Weights   06/29/21 2039 06/30/21 1403  Weight: 73.5 kg 73.3 kg    Examination:  General exam: Appears in mild distress due to nausea Respiratory system: Clear to auscultation. Respiratory effort normal. Cardiovascular system: S1 & S2 heard, RRR. No JVD, murmurs, rubs, gallops or clicks. No pedal edema.  Tachycardic Gastrointestinal system: Abdomen is nondistended, soft and nontender. No organomegaly or masses felt. Normal bowel sounds heard. Central nervous system: Alert  and oriented. No focal neurological deficits. Extremities: Symmetric 5 x 5 power. Skin: No rashes, lesions or ulcers Psychiatry: Judgement and insight appear normal. Mood & affect appropriate.     Data Reviewed: I have personally reviewed following labs and imaging studies  CBC: Recent Labs  Lab 06/25/21 1914 06/25/21 2306 06/29/21 2111 06/29/21 2250  WBC 8.7  --  10.7*  --   HGB 13.7 14.3 15.5* 17.3*  HCT 40.7 42.0 46.9* 51.0*  MCV 90.8  --  91.1  --   PLT 280  --  264  --    Basic  Metabolic Panel: Recent Labs  Lab 06/25/21 1914 06/25/21 2306 06/30/21 0750 06/30/21 1250 06/30/21 1603 07/01/21 0330 07/01/21 1010  NA 129*   < > 132* 130* 132* 128* 130*  K 4.6   < > 3.8 3.3* 2.9* 3.5 3.0*  CL 101   < > 105 106 108 100 104  CO2 15*   < > 16* 15* 19* 13* 16*  GLUCOSE 433*   < > 208* 246* 172* 578* 419*  BUN 19   < > 24* 19 17 16 15   CREATININE 1.01*   < > 1.03* 0.83 0.69 0.95 0.83  CALCIUM 9.9   < > 9.1 8.9 9.0 8.6* 9.1  MG 2.3  --   --   --   --   --   --    < > = values in this interval not displayed.   GFR: Estimated Creatinine Clearance: 96.9 mL/min (by C-G formula based on SCr of 0.83 mg/dL). Liver Function Tests: Recent Labs  Lab 06/25/21 1914  AST 25  ALT 20  ALKPHOS 115  BILITOT 1.4*  PROT 8.5*  ALBUMIN 4.8   Recent Labs  Lab 06/25/21 1914  LIPASE 40   No results for input(s): AMMONIA in the last 168 hours. Coagulation Profile: No results for input(s): INR, PROTIME in the last 168 hours. Cardiac Enzymes: No results for input(s): CKTOTAL, CKMB, CKMBINDEX, TROPONINI in the last 168 hours. BNP (last 3 results) No results for input(s): PROBNP in the last 8760 hours. HbA1C: Recent Labs    06/30/21 1941  HGBA1C 10.2*   CBG: Recent Labs  Lab 06/30/21 1840 06/30/21 2106 06/30/21 2306 07/01/21 0439 07/01/21 0751  GLUCAP 179* 150* 335* 524* 422*   Lipid Profile: No results for input(s): CHOL, HDL, LDLCALC, TRIG, CHOLHDL, LDLDIRECT in the last 72 hours. Thyroid Function Tests: No results for input(s): TSH, T4TOTAL, FREET4, T3FREE, THYROIDAB in the last 72 hours. Anemia Panel: No results for input(s): VITAMINB12, FOLATE, FERRITIN, TIBC, IRON, RETICCTPCT in the last 72 hours. Sepsis Labs: No results for input(s): PROCALCITON, LATICACIDVEN in the last 168 hours.  Recent Results (from the past 240 hour(s))  Resp Panel by RT-PCR (Flu A&B, Covid) Nasopharyngeal Swab     Status: None   Collection Time: 06/25/21 11:55 PM   Specimen:  Nasopharyngeal Swab; Nasopharyngeal(NP) swabs in vial transport medium  Result Value Ref Range Status   SARS Coronavirus 2 by RT PCR NEGATIVE NEGATIVE Final    Comment: (NOTE) SARS-CoV-2 target nucleic acids are NOT DETECTED.  The SARS-CoV-2 RNA is generally detectable in upper respiratory specimens during the acute phase of infection. The lowest concentration of SARS-CoV-2 viral copies this assay can detect is 138 copies/mL. A negative result does not preclude SARS-Cov-2 infection and should not be used as the sole basis for treatment or other patient management decisions. A negative result may occur with  improper specimen collection/handling, submission of  specimen other than nasopharyngeal swab, presence of viral mutation(s) within the areas targeted by this assay, and inadequate number of viral copies(<138 copies/mL). A negative result must be combined with clinical observations, patient history, and epidemiological information. The expected result is Negative.  Fact Sheet for Patients:  BloggerCourse.com  Fact Sheet for Healthcare Providers:  SeriousBroker.it  This test is no t yet approved or cleared by the [place] FDA and  has been authorized for detection and/or diagnosis of SARS-CoV-2 by FDA under an Emergency Use Authorization (EUA). This EUA will remain  in effect (meaning this test can be used) for the duration of the COVID-19 declaration under Section 564(b)(1) of the Act, 21 U.S.C.section 360bbb-3(b)(1), unless the authorization is terminated  or revoked sooner.       Influenza A by PCR NEGATIVE NEGATIVE Final   Influenza B by PCR NEGATIVE NEGATIVE Final    Comment: (NOTE) The Xpert Xpress SARS-CoV-2/FLU/RSV plus assay is intended as an aid in the diagnosis of influenza from Nasopharyngeal swab specimens and should not be used as a sole basis for treatment. Nasal washings and aspirates are unacceptable for  Xpert Xpress SARS-CoV-2/FLU/RSV testing.  Fact Sheet for Patients: BloggerCourse.com  Fact Sheet for Healthcare Providers: SeriousBroker.it  This test is not yet approved or cleared by the [place] FDA and has been authorized for detection and/or diagnosis of SARS-CoV-2 by FDA under an Emergency Use Authorization (EUA). This EUA will remain in effect (meaning this test can be used) for the duration of the COVID-19 declaration under Section 564(b)(1) of the Act, 21 U.S.C. section 360bbb-3(b)(1), unless the authorization is terminated or revoked.  Performed at [facility], [address] [place], [name] [address]   Resp Panel by RT-PCR (Flu A&B, Covid) Nasopharyngeal Swab     Status: None   Collection Time: 06/29/21 10:38 PM   Specimen: Nasopharyngeal Swab; Nasopharyngeal(NP) swabs in vial transport medium  Result Value Ref Range Status   SARS Coronavirus 2 by RT PCR NEGATIVE NEGATIVE Final    Comment: (NOTE) SARS-CoV-2 target nucleic acids are NOT DETECTED.  The SARS-CoV-2 RNA is generally detectable in upper respiratory specimens during the acute phase of infection. The lowest concentration of SARS-CoV-2 viral copies this assay can detect is 138 copies/mL. A negative result does not preclude SARS-Cov-2 infection and should not be used as the sole basis for treatment or other patient management decisions. A negative result may occur with  improper specimen collection/handling, submission of specimen other than nasopharyngeal swab, presence of viral mutation(s) within the areas targeted by this assay, and inadequate number of viral copies(<138 copies/mL). A negative result must be combined with clinical observations, patient history, and epidemiological information. The expected result is Negative.  Fact Sheet for Patients:  BloggerCourse.com  Fact Sheet for Healthcare Providers:   SeriousBroker.it  This test is no t yet approved or cleared by the [place] FDA and  has been authorized for detection and/or diagnosis of SARS-CoV-2 by FDA under an Emergency Use Authorization (EUA). This EUA will remain  in effect (meaning this test can be used) for the duration of the COVID-19 declaration under Section 564(b)(1) of the Act, 21 U.S.C.section 360bbb-3(b)(1), unless the authorization is terminated  or revoked sooner.       Influenza A by PCR NEGATIVE NEGATIVE Final   Influenza B by PCR NEGATIVE NEGATIVE Final    Comment: (NOTE) The Xpert Xpress SARS-CoV-2/FLU/RSV plus assay is intended as an aid in the diagnosis of influenza from Nasopharyngeal  swab specimens and should not be used as a sole basis for treatment. Nasal washings and aspirates are unacceptable for Xpert Xpress SARS-CoV-2/FLU/RSV testing.  Fact Sheet for Patients: BloggerCourse.com  Fact Sheet for Healthcare Providers: SeriousBroker.it  This test is not yet approved or cleared by the [place] FDA and has been authorized for detection and/or diagnosis of SARS-CoV-2 by FDA under an Emergency Use Authorization (EUA). This EUA will remain in effect (meaning this test can be used) for the duration of the COVID-19 declaration under Section 564(b)(1) of the Act, 21 U.S.C. section 360bbb-3(b)(1), unless the authorization is terminated or revoked.  Performed at [facility], [address] [place], [name] [address]   MRSA Next Gen by PCR, Nasal     Status: None   Collection Time: 06/30/21  2:00 PM   Specimen: Nasal Mucosa; Nasal Swab  Result Value Ref Range Status   MRSA by PCR Next Gen NOT DETECTED NOT DETECTED Final    Comment: (NOTE) The GeneXpert MRSA Assay (FDA approved for NASAL specimens only), is one component of a comprehensive MRSA colonization surveillance program. It is not intended to  diagnose MRSA infection nor to guide or monitor treatment for MRSA infections. Test performance is not FDA approved in patients less than 48 years old. Performed at [facility], [address], [place], [name] [address]          Radiology Studies: DG Chest 1 View  Result Date: 06/29/2021 CLINICAL DATA:  Body aches, hyperglycemia EXAM: CHEST  1 VIEW COMPARISON:  06/26/2021 FINDINGS: Single frontal view of the chest demonstrates an unremarkable cardiac silhouette. No acute airspace disease, effusion, or pneumothorax. No acute bony abnormalities. IMPRESSION: 1. No acute intrathoracic process. Electronically Signed   By: Sharlet Salina M.D.   On: 06/29/2021 22:38   CT Renal Stone Study  Result Date: 06/30/2021 CLINICAL DATA:  Flank pain.  Concern for kidney stone. EXAM: CT ABDOMEN AND PELVIS WITHOUT CONTRAST TECHNIQUE: Multidetector CT imaging of the abdomen and pelvis was performed following the standard protocol without IV contrast. COMPARISON:  None. FINDINGS: Evaluation of this exam is limited in the absence of intravenous contrast. Lower chest: The visualized lung bases are clear. No intra-abdominal free air or free fluid. Hepatobiliary: Fatty liver. No intrahepatic biliary dilatation. The gallbladder is unremarkable. Pancreas: Unremarkable. No pancreatic ductal dilatation or surrounding inflammatory changes. Spleen: Normal in size without focal abnormality. Adrenals/Urinary Tract: The adrenal glands unremarkable. There is a 2 mm nonobstructing right renal upper pole calculus. No hydronephrosis. The left kidney is unremarkable. The visualized ureters and urinary bladder appear unremarkable. Stomach/Bowel: No bowel obstruction or active inflammation. The appendix is normal. Vascular/Lymphatic: The abdominal aorta and IVC are unremarkable. No portal venous gas. There is no adenopathy. Reproductive: The uterus is anteverted and grossly unremarkable. No adnexal masses. Other:  There is diastasis of anterior abdominal wall musculature at the level of the umbilicus. Anterior pelvic C-section scar. Musculoskeletal: No acute or significant osseous findings. IMPRESSION: 1. A 2 mm nonobstructing right renal upper pole calculus. No hydronephrosis. 2. Fatty liver. 3. No bowel obstruction. Normal appendix. Electronically Signed   By: Elgie Collard M.D.   On: 06/30/2021 00:17        Scheduled Meds:  Chlorhexidine Gluconate Cloth  6 each Topical Daily   [START ON 07/02/2021] enoxaparin (LOVENOX) injection  40 mg Subcutaneous Q24H   insulin aspart  0-15 Units Subcutaneous TID WC   insulin aspart  0-5 Units Subcutaneous QHS  insulin aspart  2 Units Subcutaneous TID WC   insulin glargine-yfgn  8 Units Subcutaneous Daily   mouth rinse  15 mL Mouth Rinse BID   Continuous Infusions:  dextrose 5% lactated ringers     insulin     lactated ringers     potassium chloride       LOS: 1 day    Time spent: 3 MIN  Alwyn Ren, MD  07/01/2021, 11:42 AM

## 2021-07-01 NOTE — Discharge Instructions (Addendum)
Local Endocrinologists [facility] [phone]) Dr. Carlus Pavlov Dr. Reather Littler [facility] [phone]) Dr. Talmage Coin [facility] [phone]) [[place] office]  [phone]) [facility] Humphreys office] Dr. Wendall Mola  Dr. Verdis Frederickson  [facility] [phone]) Dr. Marquis Lunch  Dr. Girtha Hake. Doerr-- in [name] [phone]) Dr Beryl Meager [facility]  [phone]   Symptoms of Hypoglycemia: Lenn Cal, Shaky Check sugar if you have your meter.  If near or less than 80 mg/dl, treat with 1/2 cup juice or soda or take glucose tablets Check sugar 15 minutes after treatment.  If sugar still near or less than 80 mg/dl and symptomatic, treat again and may need a snack with some protein (peanut butter with crackers, etc)  Type 1 diabetes specialty Labs C-Peptide GAD antibody  Insulin Pen Video on You Tube: [email or web address]

## 2021-07-02 LAB — GLUCOSE, CAPILLARY
Glucose-Capillary: 121 mg/dL — ABNORMAL HIGH (ref 70–99)
Glucose-Capillary: 131 mg/dL — ABNORMAL HIGH (ref 70–99)
Glucose-Capillary: 134 mg/dL — ABNORMAL HIGH (ref 70–99)
Glucose-Capillary: 136 mg/dL — ABNORMAL HIGH (ref 70–99)
Glucose-Capillary: 139 mg/dL — ABNORMAL HIGH (ref 70–99)
Glucose-Capillary: 166 mg/dL — ABNORMAL HIGH (ref 70–99)
Glucose-Capillary: 172 mg/dL — ABNORMAL HIGH (ref 70–99)
Glucose-Capillary: 178 mg/dL — ABNORMAL HIGH (ref 70–99)
Glucose-Capillary: 180 mg/dL — ABNORMAL HIGH (ref 70–99)
Glucose-Capillary: 190 mg/dL — ABNORMAL HIGH (ref 70–99)
Glucose-Capillary: 195 mg/dL — ABNORMAL HIGH (ref 70–99)
Glucose-Capillary: 202 mg/dL — ABNORMAL HIGH (ref 70–99)
Glucose-Capillary: 210 mg/dL — ABNORMAL HIGH (ref 70–99)
Glucose-Capillary: 210 mg/dL — ABNORMAL HIGH (ref 70–99)
Glucose-Capillary: 215 mg/dL — ABNORMAL HIGH (ref 70–99)
Glucose-Capillary: 217 mg/dL — ABNORMAL HIGH (ref 70–99)
Glucose-Capillary: 308 mg/dL — ABNORMAL HIGH (ref 70–99)

## 2021-07-02 LAB — BASIC METABOLIC PANEL
Anion gap: 11 (ref 5–15)
Anion gap: 7 (ref 5–15)
BUN: 8 mg/dL (ref 6–20)
BUN: 7 mg/dL (ref 6–20)
CO2: 21 mmol/L — ABNORMAL LOW (ref 22–32)
CO2: 28 mmol/L (ref 22–32)
Calcium: 9 mg/dL (ref 8.9–10.3)
Calcium: 9 mg/dL (ref 8.9–10.3)
Chloride: 104 mmol/L (ref 98–111)
Chloride: 100 mmol/L (ref 98–111)
Creatinine, Ser: 0.53 mg/dL (ref 0.44–1.00)
Creatinine, Ser: 0.6 mg/dL (ref 0.44–1.00)
GFR, Estimated: >60 mL/min (ref >60)
GFR, Estimated: >60 mL/min (ref >60)
Glucose, Bld: 118 mg/dL — ABNORMAL HIGH (ref 70–99)
Glucose, Bld: 209 mg/dL — ABNORMAL HIGH (ref 70–99)
Potassium: 2.8 mmol/L — ABNORMAL LOW (ref 3.5–5.1)
Potassium: 2.8 mmol/L — ABNORMAL LOW (ref 3.5–5.1)
Sodium: 136 mmol/L (ref 135–145)
Sodium: 135 mmol/L (ref 135–145)

## 2021-07-02 LAB — BETA-HYDROXYBUTYRIC ACID
Beta-Hydroxybutyric Acid: 2.39 mmol/L — ABNORMAL HIGH (ref 0.05–0.27)
Beta-Hydroxybutyric Acid: 0.5 mmol/L — ABNORMAL HIGH (ref 0.05–0.27)

## 2021-07-02 MED ORDER — INSULIN ASPART 100 UNIT/ML IJ SOLN
0.0000 [IU] | Freq: Every day | INTRAMUSCULAR | Status: DC
  Administered 2021-07-02: 4 [IU] via SUBCUTANEOUS

## 2021-07-02 MED ORDER — FUROSEMIDE 10 MG/ML IJ SOLN
20.0000 mg | Freq: Once | INTRAMUSCULAR | Status: AC
  Administered 2021-07-02: 20 mg via INTRAVENOUS
  Filled 2021-07-02: qty 2

## 2021-07-02 MED ORDER — INSULIN ASPART 100 UNIT/ML IJ SOLN
3.0000 [IU] | Freq: Three times a day (TID) | INTRAMUSCULAR | Status: DC
  Administered 2021-07-03 (×2): 3 [IU] via SUBCUTANEOUS

## 2021-07-02 MED ORDER — IBUPROFEN 200 MG PO TABS: 400.0000 mg | ORAL_TABLET | Freq: Four times a day (QID) | ORAL | Status: DC | PRN

## 2021-07-02 MED ORDER — INSULIN ASPART 100 UNIT/ML IJ SOLN
0.0000 [IU] | Freq: Three times a day (TID) | INTRAMUSCULAR | Status: DC
  Administered 2021-07-03: 9 [IU] via SUBCUTANEOUS

## 2021-07-02 MED ORDER — INSULIN GLARGINE-YFGN 100 UNIT/ML ~~LOC~~ SOLN
15.0000 [IU] | Freq: Every day | SUBCUTANEOUS | Status: DC
  Administered 2021-07-02 – 2021-07-03 (×2): 15 [IU] via SUBCUTANEOUS
  Filled 2021-07-02 (×2): qty 0.15

## 2021-07-02 MED ORDER — POTASSIUM CHLORIDE CRYS ER 20 MEQ PO TBCR
40.0000 meq | EXTENDED_RELEASE_TABLET | ORAL | Status: AC
  Administered 2021-07-02 (×2): 40 meq via ORAL
  Filled 2021-07-02 (×2): qty 2

## 2021-07-02 NOTE — Progress Notes (Signed)
Inpatient Diabetes Program Recommendations  AACE/ADA: New Consensus Statement on Inpatient Glycemic Control (2015)  Target Ranges:  Prepandial:   less than 140 mg/dL      Peak postprandial:   less than 180 mg/dL (1-2 hours)      Critically ill patients:  140 - 180 mg/dL   Lab Results  Component Value Date   GLUCAP 195 (H) 07/02/2021   HGBA1C 10.2 (H) 06/30/2021    Review of Glycemic Control  Latest Reference Range & Units 07/02/21 06:33 07/02/21 07:43 07/02/21 08:52  Glucose-Capillary 70 - 99 mg/dL 300 (H) 923 (H) 300 (H)  (H): Data is abnormally high Diabetes history: DM2 Outpatient Diabetes medications: metformin 500 BID (did not get prescription filled on 06/25/21) Current orders for Inpatient glycemic control: IV insulin    Inpatient Diabetes Program Recommendations:     When ready to transition consider: -Semglee 15 units two hours prior to discontinuation, then QD to follow -Novolog 0-9 units TID & HS (when diet resumes) -Novolog 3 units TID (assuming patient consuming >50 % of meals).   Thanks, Lujean Rave, MSN, RNC-OB Diabetes Coordinator [phone] (8a-5p)

## 2021-07-02 NOTE — Progress Notes (Signed)
°  Transition of Care [facility]) Screening Note   Patient Details  Name: Taylor Hood Date of Birth: [birth year]   Transition of Care [facility]) CM/SW Contact:    Fatih Stalvey, Marjie Skiff, RN Phone Number: 07/02/2021, 11:58 AM    Transition of Care Department [facility]) has reviewed patient and no TOC needs have been identified at this time. We will continue to monitor patient advancement through interdisciplinary progression rounds. If new patient transition needs arise, please place a TOC consult.

## 2021-07-02 NOTE — Progress Notes (Signed)
PROGRESS NOTE    Taylor Hood  [id number] DOB: 09/18/1985 DOA: 06/29/2021 PCP: Benito Mccreedy, MD   Brief Narrative:  Taylor Hood is a 36 y.o. female with medical history significant of type 2 diabetes, lupus admitted with nausea and vomiting and abdominal pain. She reports she was diagnosed with type 2 diabetes in 2020 was started on insulin and metformin.  However these were stopped due to hypoglycemia 4 months later. She was in the ER on December 25 with blurry vision and she was discharged on metformin which she never picked up apparently she reports she did not have any diabetic supplies to check at home however she did not pick up her prescriptions for metformin.  MRI of the brain at that time showed no acute abnormalities. She has occasional cough but no fever chills dysuria chest pain or shortness of breath.  She had COVID in 2020.  She had not taken any vaccines for COVID. She states that she has a rheumatologist that she follows up for lupus.  However she was not started on any medications since she was breast-feeding her 65-year-old baby.   ED Course: She was started on insulin drip. ABG 7.0 03/28/2023 sodium 127 potassium 5.4 Her anion gap on admission was 18 BUN 31 creatinine 1.32 potassium 3.9 sodium 130.  Assessment & Plan:   Principal Problem:   DKA (diabetic ketoacidosis) ([place])   #1 DKA due to noncompliance.  No evidence of signs and symptoms of infection.  Urine positive for ketones.  Anion gap on admission was 18. She remains on the endotool with resolved gap  Bmp now  Check hemoglobin A1c this admission was 10.2. Continue insulin drip per Endo tool protocol   #2 history of lupus follow-up with rheumatology  #3 history of adrenal insufficiency not on any treatments prior to admission.  Estimated body mass index is 26.08 kg/m as calculated from the following:   Height as of this encounter: 5\' 6"  (1.676 m).   Weight as of this encounter: 73.3  kg.  DVT prophylaxis: Lovenox Code Status: full Family Communication: None at bedside Disposition Plan:  Status is: Inpatient  Remains inpatient appropriate because: DKA   Consultants:  None  Procedures: None Antimicrobials: None  Subjective:feels better  Nausea better  Gap closed Bhb still elevated  On and off tachy C/o lower extremity pains and swelling no chest pain or sob  Objective: Vitals:   07/02/21 0700 07/02/21 0746 07/02/21 0800 07/02/21 0900  BP: 130/69  (!) 141/84 135/80  Pulse: (!) 117  84 83  Resp: (!) 28  15 15   Temp:  98.3 F (36.8 C)    TempSrc:  Oral    SpO2: 100%  99% 98%  Weight:      Height:        Intake/Output Summary (Last 24 hours) at 07/02/2021 1001 Last data filed at 07/02/2021 0729 Gross per 24 hour  Intake 2849.58 ml  Output --  Net 2849.58 ml    Filed Weights   06/29/21 2039 06/30/21 1403  Weight: 73.5 kg 73.3 kg    Examination:  General exam: Appears in nad Respiratory system: Clear to auscultation. Respiratory effort normal. Cardiovascular system: S1 & S2 heard, RRR. No JVD, murmurs, rubs, gallops or clicks. No pedal edema.  Tachycardic Gastrointestinal system: Abdomen is nondistended, soft and nontender. No organomegaly or masses felt. Normal bowel sounds heard. Central nervous system: Alert and oriented. No focal neurological deficits. Extremities:trace symmetrical edema b/l Skin: No rashes, lesions or  ulcers Psychiatry: Judgement and insight appear normal. Mood & affect appropriate.     Data Reviewed: I have personally reviewed following labs and imaging studies  CBC: Recent Labs  Lab 06/25/21 1914 06/25/21 2306 06/29/21 2111 06/29/21 2250  WBC 8.7  --  10.7*  --   HGB 13.7 14.3 15.5* 17.3*  HCT 40.7 42.0 46.9* 51.0*  MCV 90.8  --  91.1  --   PLT 280  --  264  --     Basic Metabolic Panel: Recent Labs  Lab 06/25/21 1914 06/25/21 2306 07/01/21 1010 07/01/21 1317 07/01/21 1812 07/01/21 2241  07/02/21 0244  NA 129*   < > 130* 132* 136 136 136  K 4.6   < > 3.0* 3.1* 2.9* 2.6* 2.8*  CL 101   < > 104 109 107 106 104  CO2 15*   < > 16* 15* 22 23 21*  GLUCOSE 433*   < > 419* 251* 165* 122* 118*  BUN 19   < > 15 15 11 10 8   CREATININE 1.01*   < > 0.83 0.59 0.63 0.61 0.53  CALCIUM 9.9   < > 9.1 8.7* 8.8* 8.9 9.0  MG 2.3  --   --   --   --   --   --    < > = values in this interval not displayed.    GFR: Estimated Creatinine Clearance: 100.6 mL/min (by C-G formula based on SCr of 0.53 mg/dL). Liver Function Tests: Recent Labs  Lab 06/25/21 1914  AST 25  ALT 20  ALKPHOS 115  BILITOT 1.4*  PROT 8.5*  ALBUMIN 4.8    Recent Labs  Lab 06/25/21 1914  LIPASE 40    No results for input(s): AMMONIA in the last 168 hours. Coagulation Profile: No results for input(s): INR, PROTIME in the last 168 hours. Cardiac Enzymes: No results for input(s): CKTOTAL, CKMB, CKMBINDEX, TROPONINI in the last 168 hours. BNP (last 3 results) No results for input(s): PROBNP in the last 8760 hours. HbA1C: Recent Labs    06/30/21 1941  HGBA1C 10.2*    CBG: Recent Labs  Lab 07/02/21 0511 07/02/21 0633 07/02/21 0743 07/02/21 0852 07/02/21 0952  GLUCAP 136* 180* 202* 195* 217*    Lipid Profile: No results for input(s): CHOL, HDL, LDLCALC, TRIG, CHOLHDL, LDLDIRECT in the last 72 hours. Thyroid Function Tests: No results for input(s): TSH, T4TOTAL, FREET4, T3FREE, THYROIDAB in the last 72 hours. Anemia Panel: No results for input(s): VITAMINB12, FOLATE, FERRITIN, TIBC, IRON, RETICCTPCT in the last 72 hours. Sepsis Labs: No results for input(s): PROCALCITON, LATICACIDVEN in the last 168 hours.  Recent Results (from the past 240 hour(s))  Resp Panel by RT-PCR (Flu A&B, Covid) Nasopharyngeal Swab     Status: None   Collection Time: 06/25/21 11:55 PM   Specimen: Nasopharyngeal Swab; Nasopharyngeal(NP) swabs in vial transport medium  Result Value Ref Range Status   SARS Coronavirus  2 by RT PCR NEGATIVE NEGATIVE Final    Comment: (NOTE) SARS-CoV-2 target nucleic acids are NOT DETECTED.  The SARS-CoV-2 RNA is generally detectable in upper respiratory specimens during the acute phase of infection. The lowest concentration of SARS-CoV-2 viral copies this assay can detect is 138 copies/mL. A negative result does not preclude SARS-Cov-2 infection and should not be used as the sole basis for treatment or other patient management decisions. A negative result may occur with  improper specimen collection/handling, submission of specimen other than nasopharyngeal swab, presence of viral mutation(s) within the  areas targeted by this assay, and inadequate number of viral copies(<138 copies/mL). A negative result must be combined with clinical observations, patient history, and epidemiological information. The expected result is Negative.  Fact Sheet for Patients:  EntrepreneurPulse.com.au  Fact Sheet for Healthcare Providers:  IncredibleEmployment.be  This test is no t yet approved or cleared by the [place] FDA and  has been authorized for detection and/or diagnosis of SARS-CoV-2 by FDA under an Emergency Use Authorization (EUA). This EUA will remain  in effect (meaning this test can be used) for the duration of the COVID-19 declaration under Section 564(b)(1) of the Act, 21 U.S.C.section 360bbb-3(b)(1), unless the authorization is terminated  or revoked sooner.       Influenza A by PCR NEGATIVE NEGATIVE Final   Influenza B by PCR NEGATIVE NEGATIVE Final    Comment: (NOTE) The Xpert Xpress SARS-CoV-2/FLU/RSV plus assay is intended as an aid in the diagnosis of influenza from Nasopharyngeal swab specimens and should not be used as a sole basis for treatment. Nasal washings and aspirates are unacceptable for Xpert Xpress SARS-CoV-2/FLU/RSV testing.  Fact Sheet for Patients: EntrepreneurPulse.com.au  Fact  Sheet for Healthcare Providers: IncredibleEmployment.be  This test is not yet approved or cleared by the [place] FDA and has been authorized for detection and/or diagnosis of SARS-CoV-2 by FDA under an Emergency Use Authorization (EUA). This EUA will remain in effect (meaning this test can be used) for the duration of the COVID-19 declaration under Section 564(b)(1) of the Act, 21 U.S.C. section 360bbb-3(b)(1), unless the authorization is terminated or revoked.  Performed at [facility], [place] [address], [place], [place] [address]   Resp Panel by RT-PCR (Flu A&B, Covid) Nasopharyngeal Swab     Status: None   Collection Time: 06/29/21 10:38 PM   Specimen: Nasopharyngeal Swab; Nasopharyngeal(NP) swabs in vial transport medium  Result Value Ref Range Status   SARS Coronavirus 2 by RT PCR NEGATIVE NEGATIVE Final    Comment: (NOTE) SARS-CoV-2 target nucleic acids are NOT DETECTED.  The SARS-CoV-2 RNA is generally detectable in upper respiratory specimens during the acute phase of infection. The lowest concentration of SARS-CoV-2 viral copies this assay can detect is 138 copies/mL. A negative result does not preclude SARS-Cov-2 infection and should not be used as the sole basis for treatment or other patient management decisions. A negative result may occur with  improper specimen collection/handling, submission of specimen other than nasopharyngeal swab, presence of viral mutation(s) within the areas targeted by this assay, and inadequate number of viral copies(<138 copies/mL). A negative result must be combined with clinical observations, patient history, and epidemiological information. The expected result is Negative.  Fact Sheet for Patients:  EntrepreneurPulse.com.au  Fact Sheet for Healthcare Providers:  IncredibleEmployment.be  This test is no t yet approved or cleared by the [place] FDA and  has been  authorized for detection and/or diagnosis of SARS-CoV-2 by FDA under an Emergency Use Authorization (EUA). This EUA will remain  in effect (meaning this test can be used) for the duration of the COVID-19 declaration under Section 564(b)(1) of the Act, 21 U.S.C.section 360bbb-3(b)(1), unless the authorization is terminated  or revoked sooner.       Influenza A by PCR NEGATIVE NEGATIVE Final   Influenza B by PCR NEGATIVE NEGATIVE Final    Comment: (NOTE) The Xpert Xpress SARS-CoV-2/FLU/RSV plus assay is intended as an aid in the diagnosis of influenza from Nasopharyngeal swab specimens and should not be used as a sole basis  for treatment. Nasal washings and aspirates are unacceptable for Xpert Xpress SARS-CoV-2/FLU/RSV testing.  Fact Sheet for Patients: EntrepreneurPulse.com.au  Fact Sheet for Healthcare Providers: IncredibleEmployment.be  This test is not yet approved or cleared by the [place] FDA and has been authorized for detection and/or diagnosis of SARS-CoV-2 by FDA under an Emergency Use Authorization (EUA). This EUA will remain in effect (meaning this test can be used) for the duration of the COVID-19 declaration under Section 564(b)(1) of the Act, 21 U.S.C. section 360bbb-3(b)(1), unless the authorization is terminated or revoked.  Performed at [facility], [address] [place], [place] [address]   MRSA Next Gen by PCR, Nasal     Status: None   Collection Time: 06/30/21  2:00 PM   Specimen: Nasal Mucosa; Nasal Swab  Result Value Ref Range Status   MRSA by PCR Next Gen NOT DETECTED NOT DETECTED Final    Comment: (NOTE) The GeneXpert MRSA Assay (FDA approved for NASAL specimens only), is one component of a comprehensive MRSA colonization surveillance program. It is not intended to diagnose MRSA infection nor to guide or monitor treatment for MRSA infections. Test performance is not FDA approved in patients less  than 62 years old. Performed at [facility], [place] [address], [place], [place] [address]           Radiology Studies: No results found.      Scheduled Meds:  Chlorhexidine Gluconate Cloth  6 each Topical Daily   enoxaparin (LOVENOX) injection  40 mg Subcutaneous Q24H   mouth rinse  15 mL Mouth Rinse BID   Continuous Infusions:  dextrose 5% lactated ringers 125 mL/hr at 07/02/21 0729   insulin 2.6 Units/hr (07/02/21 0729)   lactated ringers Stopped (07/01/21 1331)     LOS: 2 days    Time spent: [place]  Georgette Shell, MD  07/02/2021, 10:01 AM

## 2021-07-03 LAB — BASIC METABOLIC PANEL
Anion gap: 11 (ref 5–15)
Anion gap: 7 (ref 5–15)
BUN: 12 mg/dL (ref 6–20)
BUN: 17 mg/dL (ref 6–20)
CO2: 22 mmol/L (ref 22–32)
CO2: 26 mmol/L (ref 22–32)
Calcium: 9.1 mg/dL (ref 8.9–10.3)
Calcium: 9.4 mg/dL (ref 8.9–10.3)
Chloride: 102 mmol/L (ref 98–111)
Chloride: 102 mmol/L (ref 98–111)
Creatinine, Ser: 0.66 mg/dL (ref 0.44–1.00)
Creatinine, Ser: 0.75 mg/dL (ref 0.44–1.00)
GFR, Estimated: >60 mL/min (ref >60)
GFR, Estimated: >60 mL/min (ref >60)
Glucose, Bld: 390 mg/dL — ABNORMAL HIGH (ref 70–99)
Glucose, Bld: 343 mg/dL — ABNORMAL HIGH (ref 70–99)
Potassium: 3.9 mmol/L (ref 3.5–5.1)
Potassium: 3.3 mmol/L — ABNORMAL LOW (ref 3.5–5.1)
Sodium: 135 mmol/L (ref 135–145)
Sodium: 135 mmol/L (ref 135–145)

## 2021-07-03 LAB — GLUCOSE, CAPILLARY
Glucose-Capillary: 371 mg/dL — ABNORMAL HIGH (ref 70–99)
Glucose-Capillary: 400 mg/dL — ABNORMAL HIGH (ref 70–99)
Glucose-Capillary: 355 mg/dL — ABNORMAL HIGH (ref 70–99)
Glucose-Capillary: 339 mg/dL — ABNORMAL HIGH (ref 70–99)

## 2021-07-03 LAB — MAGNESIUM: Magnesium: 1.9 mg/dL (ref 1.7–2.4)

## 2021-07-03 LAB — BETA-HYDROXYBUTYRIC ACID
Beta-Hydroxybutyric Acid: 2.83 mmol/L — ABNORMAL HIGH (ref 0.05–0.27)
Beta-Hydroxybutyric Acid: 0.72 mmol/L — ABNORMAL HIGH (ref 0.05–0.27)

## 2021-07-03 MED ORDER — INSULIN GLARGINE-YFGN 100 UNIT/ML ~~LOC~~ SOLN
5.0000 [IU] | Freq: Once | SUBCUTANEOUS | Status: AC
  Administered 2021-07-03: 5 [IU] via SUBCUTANEOUS
  Filled 2021-07-03: qty 0.05

## 2021-07-03 MED ORDER — INSULIN ASPART 100 UNIT/ML IJ SOLN
0.0000 [IU] | Freq: Every day | INTRAMUSCULAR | Status: DC
  Administered 2021-07-03: 4 [IU] via SUBCUTANEOUS

## 2021-07-03 MED ORDER — METOPROLOL TARTRATE 12.5 MG HALF TABLET
12.5000 mg | ORAL_TABLET | Freq: Two times a day (BID) | ORAL | Status: DC
  Administered 2021-07-03 – 2021-07-04 (×3): 12.5 mg via ORAL
  Filled 2021-07-03 (×3): qty 1

## 2021-07-03 MED ORDER — INSULIN ASPART 100 UNIT/ML IJ SOLN
0.0000 [IU] | Freq: Three times a day (TID) | INTRAMUSCULAR | Status: DC
  Administered 2021-07-03 (×2): 15 [IU] via SUBCUTANEOUS
  Administered 2021-07-04: 11 [IU] via SUBCUTANEOUS
  Administered 2021-07-04: 15 [IU] via SUBCUTANEOUS

## 2021-07-03 MED ORDER — INSULIN ASPART 100 UNIT/ML IJ SOLN
5.0000 [IU] | Freq: Three times a day (TID) | INTRAMUSCULAR | Status: DC
  Administered 2021-07-03 – 2021-07-04 (×3): 5 [IU] via SUBCUTANEOUS

## 2021-07-03 MED ORDER — INSULIN GLARGINE-YFGN 100 UNIT/ML ~~LOC~~ SOLN
22.0000 [IU] | Freq: Every day | SUBCUTANEOUS | Status: DC
  Administered 2021-07-04: 22 [IU] via SUBCUTANEOUS
  Filled 2021-07-03: qty 0.22

## 2021-07-03 NOTE — Progress Notes (Addendum)
Inpatient Diabetes Program Recommendations  AACE/ADA: New Consensus Statement on Inpatient Glycemic Control (2015)  Target Ranges:  Prepandial:   less than 140 mg/dL      Peak postprandial:   less than 180 mg/dL (1-2 hours)      Critically ill patients:  140 - 180 mg/dL    Latest Reference Range & Units 07/02/21 09:52 07/02/21 10:57 07/02/21 11:51 07/02/21 13:06 07/02/21 14:19 07/02/21 15:25 07/02/21 16:17 07/02/21 17:06 07/02/21 21:44  Glucose-Capillary 70 - 99 mg/dL 217 (H)  IV Insulin Drip 210 (H) 190 (H) 215 (H) 210 (H) 166 (H) 178 (H) 172 (H)  15 units Semglee @1745   IV Insulin Drip Stopped @1945  308 (H)  4 units Novolog @2221     Latest Reference Range & Units 07/03/21 07:54  Glucose-Capillary 70 - 99 mg/dL 371 (H)  12 units Novolog     Latest Reference Range & Units 06/30/21 19:41  Hemoglobin A1C 4.8 - 5.6 % 10.2 (H)  (246 mg/dl)    Admit with: DKA due to noncompliance  History: DM2  Home DM Meds: Metformin 500 BID (did not get prescription filled on 06/25/21)  Current Orders: Semglee 15 units daily      Novolog Sensitive Correction Scale/ SSI (0-9 units) TID AC + HS      Novolog 3 units TID with meals   MD- Note CBG 371 this AM.  Please consider increasing the Semglee to 20 units Daily (0.3 units/kg)  If 15 unit dose already given, please order Semglee 5 units X 1 dose to be given as well this AM  Per conversation with pt this AM, pt would prefer insulin pens if insulin prescribed at time of discharge: Lantus pen- Order # [id number] Novolog pen- Order # [id number] Insulin Pen needles- Order # [id number] CBG meter and supplies- Order # [id number]    Per DM RN notes from 07/01/2021: "Spoke with patient regarding outpatient diabetes management. Patient was diagnosed with type 2 dm in march of 2020 and used insulin pens. Reports that insulin was discontinued shortly after due to hypoglycemia. During pregnancy, patient continued to experience hypoglycemia, thus did not  require insulin. Is followed by Dr Posey Pronto for diabetes and adrenal insufficiency; is no longer taking Cortef because she felt like it made her diabetes worse."   Addendum 11am--Met w/ pt at bedside this AM to review events of last several days.  Pt reviewed the above info with me that she shared with the Diabetes RN on 12/31.  Discussed with patient diagnosis of DKA (pathophysiology), treatment of DKA, lab results, and transition plan to SQ insulin regimen.  We also reviewed possibility of need for insulin for home--Pt told me Dr. Zigmund Daniel likely will send her home on insulin.  We reviewed the difference between long-acting and rapid-acting insulins (what each is, how each works, when to take, etc).  Reminded pt to be mindful of the importance of the timing of insulin and to always take her basal insulin even when not able to consume much food.  We reviewed goal CBGs and signs/symptoms of HYPO and how to treat HYPO at home.  Also reviewed how to use insulin pen--including use of pen, storage of pens, rotation of injection sites, disposal of sharps, etc.  Pt given You tube video to watch to re-review insulin pens as well (pt used insulin pens back in early 2020).  Pt has follow up appt with Antonieta Pert, PA with [facility] on 01/03--has follow up appt with ENDO on 01/25.  Stressed to pt  the importance of keeping both of these appts.  Gave pt additional names of local ENDOs as pt expressed interest in changing ENDOs.       --Will follow patient during hospitalization--  Wyn Quaker RN, MSN, CDE Diabetes Coordinator Inpatient Glycemic Control Team Team Pager: [phone] (8a-5p)

## 2021-07-03 NOTE — Progress Notes (Signed)
PROGRESS NOTE    Taylor Hood  TKZ:[id number] DOB: 12-24-1985 DOA: 06/29/2021 PCP: Jackie Plum, MD   Brief Narrative:  Taylor Hood is a 36 y.o. female with medical history significant of type 2 diabetes, lupus admitted with nausea and vomiting and abdominal pain. She reports she was diagnosed with type 2 diabetes in 2020 was started on insulin and metformin.  However these were stopped due to hypoglycemia 4 months later. She was in the ER on December 25 with blurry vision and she was discharged on metformin which she never picked up apparently she reports she did not have any diabetic supplies to check at home however she did not pick up her prescriptions for metformin.  MRI of the brain at that time showed no acute abnormalities. She has occasional cough but no fever chills dysuria chest pain or shortness of breath.  She had COVID in 2020.  She had not taken any vaccines for COVID. She states that she has a rheumatologist that she follows up for lupus.  However she was not started on any medications since she was breast-feeding her 45-year-old baby.   ED Course: She was started on insulin drip. ABG 7.0 03/28/2023 sodium 127 potassium 5.4 Her anion gap on admission was 18 BUN 31 creatinine 1.32 potassium 3.9 sodium 130.  Assessment & Plan:   Principal Problem:   DKA (diabetic ketoacidosis) (HCC)   #1 DKA due to noncompliance.  No evidence of signs and symptoms of infection.  Urine positive for ketones.  Anion gap on admission was 18  Patient was on insulin drip twice and was taken of with long-acting insulin on board.  Her blood sugar is starting to trend up again with Semglee on board.  We will keep her tonight and monitor her closely will check another BMP and beta hydroxybutyrate this evening.  I hope we do not have to put her back on insulin drip again for the third time during this admission. Check hemoglobin A1c this admission was 10.2.   #2 history of lupus follow-up  with rheumatology  #3 history of adrenal insufficiency not on any treatments prior to admission.  #4 new onset hypertension and tachycardia we will start her on metoprolol 12.5 twice daily.  She tells me she does not have a history of hypertension prior to coming into the hospital.  Estimated body mass index is 26.08 kg/m as calculated from the following:   Height as of this encounter: 5\' 6"  (1.676 m).   Weight as of this encounter: 73.3 kg.  DVT prophylaxis: Lovenox Code Status: full Family Communication: None at bedside Disposition Plan:  Status is: Inpatient  Remains inpatient appropriate because: DKA   Consultants:  None  Procedures: None Antimicrobials: None  Subjective: She tolerated her diet and she ambulated in the hallway with some tachycardia up to 113.  Nausea is better however sugar is now trending up again.  We will repeat BMP and beta hydroxybutyrate again this evening. Objective: Vitals:   07/03/21 0800 07/03/21 0804 07/03/21 1100 07/03/21 1200  BP:  135/86 (!) 152/99 131/87  Pulse:  84 (!) 107 96  Resp:  17 18 18   Temp: 97.9 F (36.6 C)   98.2 F (36.8 C)  TempSrc: Oral   Oral  SpO2:  98% 99% 98%  Weight:      Height:        Intake/Output Summary (Last 24 hours) at 07/03/2021 1555 Last data filed at 07/03/2021 1301 Gross per 24 hour  Intake  1610.963664.33 ml  Output 700 ml  Net 2964.33 ml    Filed Weights   06/29/21 2039 06/30/21 1403  Weight: 73.5 kg 73.3 kg    Examination:  General exam: Appears in nad Respiratory system: Clear to auscultation. Respiratory effort normal. Cardiovascular system: S1 & S2 heard, RRR. No JVD, murmurs, rubs, gallops or clicks. No pedal edema.  Tachycardic Gastrointestinal system: Abdomen is nondistended, soft and nontender. No organomegaly or masses felt. Normal bowel sounds heard. Central nervous system: Alert and oriented. No focal neurological deficits. Extremities:trace symmetrical edema b/l Skin: No rashes, lesions  or ulcers Psychiatry: Judgement and insight appear normal. Mood & affect appropriate.     Data Reviewed: I have personally reviewed following labs and imaging studies  CBC: Recent Labs  Lab 06/29/21 2111 06/29/21 2250  WBC 10.7*  --   HGB 15.5* 17.3*  HCT 46.9* 51.0*  MCV 91.1  --   PLT 264  --     Basic Metabolic Panel: Recent Labs  Lab 07/01/21 1812 07/01/21 2241 07/02/21 0244 07/02/21 1327 07/03/21 0734  NA 136 136 136 135 135  K 2.9* 2.6* 2.8* 2.8* 3.9  CL 107 106 104 100 102  CO2 22 23 21* 28 22  GLUCOSE 165* 122* 118* 209* 390*  BUN 11 10 8 7 12   CREATININE 0.63 0.61 0.53 0.60 0.66  CALCIUM 8.8* 8.9 9.0 9.0 9.1  MG  --   --   --   --  1.9    GFR: Estimated Creatinine Clearance: 100.6 mL/min (by C-G formula based on SCr of 0.66 mg/dL). Liver Function Tests: No results for input(s): AST, ALT, ALKPHOS, BILITOT, PROT, ALBUMIN in the last 168 hours.  No results for input(s): LIPASE, AMYLASE in the last 168 hours.  No results for input(s): AMMONIA in the last 168 hours. Coagulation Profile: No results for input(s): INR, PROTIME in the last 168 hours. Cardiac Enzymes: No results for input(s): CKTOTAL, CKMB, CKMBINDEX, TROPONINI in the last 168 hours. BNP (last 3 results) No results for input(s): PROBNP in the last 8760 hours. HbA1C: Recent Labs    06/30/21 1941  HGBA1C 10.2*    CBG: Recent Labs  Lab 07/02/21 1617 07/02/21 1706 07/02/21 2144 07/03/21 0754 07/03/21 1155  GLUCAP 178* 172* 308* 371* 400*    Lipid Profile: No results for input(s): CHOL, HDL, LDLCALC, TRIG, CHOLHDL, LDLDIRECT in the last 72 hours. Thyroid Function Tests: No results for input(s): TSH, T4TOTAL, FREET4, T3FREE, THYROIDAB in the last 72 hours. Anemia Panel: No results for input(s): VITAMINB12, FOLATE, FERRITIN, TIBC, IRON, RETICCTPCT in the last 72 hours. Sepsis Labs: No results for input(s): PROCALCITON, LATICACIDVEN in the last 168 hours.  Recent Results (from the  past 240 hour(s))  Resp Panel by RT-PCR (Flu A&B, Covid) Nasopharyngeal Swab     Status: None   Collection Time: 06/25/21 11:55 PM   Specimen: Nasopharyngeal Swab; Nasopharyngeal(NP) swabs in vial transport medium  Result Value Ref Range Status   SARS Coronavirus 2 by RT PCR NEGATIVE NEGATIVE Final    Comment: (NOTE) SARS-CoV-2 target nucleic acids are NOT DETECTED.  The SARS-CoV-2 RNA is generally detectable in upper respiratory specimens during the acute phase of infection. The lowest concentration of SARS-CoV-2 viral copies this assay can detect is 138 copies/mL. A negative result does not preclude SARS-Cov-2 infection and should not be used as the sole basis for treatment or other patient management decisions. A negative result may occur with  improper specimen collection/handling, submission of specimen other than  nasopharyngeal swab, presence of viral mutation(s) within the areas targeted by this assay, and inadequate number of viral copies(<138 copies/mL). A negative result must be combined with clinical observations, patient history, and epidemiological information. The expected result is Negative.  Fact Sheet for Patients:  BloggerCourse.comhttps://www.fda.gov/media/152166/download  Fact Sheet for Healthcare Providers:  SeriousBroker.ithttps://www.fda.gov/media/152162/download  This test is no t yet approved or cleared by the [place] FDA and  has been authorized for detection and/or diagnosis of SARS-CoV-2 by FDA under an Emergency Use Authorization (EUA). This EUA will remain  in effect (meaning this test can be used) for the duration of the COVID-19 declaration under Section 564(b)(1) of the Act, 21 U.S.C.section 360bbb-3(b)(1), unless the authorization is terminated  or revoked sooner.       Influenza A by PCR NEGATIVE NEGATIVE Final   Influenza B by PCR NEGATIVE NEGATIVE Final    Comment: (NOTE) The Xpert Xpress SARS-CoV-2/FLU/RSV plus assay is intended as an aid in the diagnosis of  influenza from Nasopharyngeal swab specimens and should not be used as a sole basis for treatment. Nasal washings and aspirates are unacceptable for Xpert Xpress SARS-CoV-2/FLU/RSV testing.  Fact Sheet for Patients: BloggerCourse.comhttps://www.fda.gov/media/152166/download  Fact Sheet for Healthcare Providers: SeriousBroker.ithttps://www.fda.gov/media/152162/download  This test is not yet approved or cleared by the [place] FDA and has been authorized for detection and/or diagnosis of SARS-CoV-2 by FDA under an Emergency Use Authorization (EUA). This EUA will remain in effect (meaning this test can be used) for the duration of the COVID-19 declaration under Section 564(b)(1) of the Act, 21 U.S.C. section 360bbb-3(b)(1), unless the authorization is terminated or revoked.  Performed at [facility], [address] [place], [place] [address]   Resp Panel by RT-PCR (Flu A&B, Covid) Nasopharyngeal Swab     Status: None   Collection Time: 06/29/21 10:38 PM   Specimen: Nasopharyngeal Swab; Nasopharyngeal(NP) swabs in vial transport medium  Result Value Ref Range Status   SARS Coronavirus 2 by RT PCR NEGATIVE NEGATIVE Final    Comment: (NOTE) SARS-CoV-2 target nucleic acids are NOT DETECTED.  The SARS-CoV-2 RNA is generally detectable in upper respiratory specimens during the acute phase of infection. The lowest concentration of SARS-CoV-2 viral copies this assay can detect is 138 copies/mL. A negative result does not preclude SARS-Cov-2 infection and should not be used as the sole basis for treatment or other patient management decisions. A negative result may occur with  improper specimen collection/handling, submission of specimen other than nasopharyngeal swab, presence of viral mutation(s) within the areas targeted by this assay, and inadequate number of viral copies(<138 copies/mL). A negative result must be combined with clinical observations, patient history, and epidemiological information. The  expected result is Negative.  Fact Sheet for Patients:  BloggerCourse.comhttps://www.fda.gov/media/152166/download  Fact Sheet for Healthcare Providers:  SeriousBroker.ithttps://www.fda.gov/media/152162/download  This test is no t yet approved or cleared by the [place] FDA and  has been authorized for detection and/or diagnosis of SARS-CoV-2 by FDA under an Emergency Use Authorization (EUA). This EUA will remain  in effect (meaning this test can be used) for the duration of the COVID-19 declaration under Section 564(b)(1) of the Act, 21 U.S.C.section 360bbb-3(b)(1), unless the authorization is terminated  or revoked sooner.       Influenza A by PCR NEGATIVE NEGATIVE Final   Influenza B by PCR NEGATIVE NEGATIVE Final    Comment: (NOTE) The Xpert Xpress SARS-CoV-2/FLU/RSV plus assay is intended as an aid in the diagnosis of influenza from Nasopharyngeal swab specimens and  should not be used as a sole basis for treatment. Nasal washings and aspirates are unacceptable for Xpert Xpress SARS-CoV-2/FLU/RSV testing.  Fact Sheet for Patients: BloggerCourse.com  Fact Sheet for Healthcare Providers: SeriousBroker.it  This test is not yet approved or cleared by the [place] FDA and has been authorized for detection and/or diagnosis of SARS-CoV-2 by FDA under an Emergency Use Authorization (EUA). This EUA will remain in effect (meaning this test can be used) for the duration of the COVID-19 declaration under Section 564(b)(1) of the Act, 21 U.S.C. section 360bbb-3(b)(1), unless the authorization is terminated or revoked.  Performed at [facility], [address] [place], [name] [address]   MRSA Next Gen by PCR, Nasal     Status: None   Collection Time: 06/30/21  2:00 PM   Specimen: Nasal Mucosa; Nasal Swab  Result Value Ref Range Status   MRSA by PCR Next Gen NOT DETECTED NOT DETECTED Final    Comment: (NOTE) The GeneXpert MRSA Assay (FDA  approved for NASAL specimens only), is one component of a comprehensive MRSA colonization surveillance program. It is not intended to diagnose MRSA infection nor to guide or monitor treatment for MRSA infections. Test performance is not FDA approved in patients less than 62 years old. Performed at [facility], [address], [place], [name] [address]           Radiology Studies: No results found.      Scheduled Meds:  Chlorhexidine Gluconate Cloth  6 each Topical Daily   enoxaparin (LOVENOX) injection  40 mg Subcutaneous Q24H   insulin aspart  0-15 Units Subcutaneous TID WC   insulin aspart  0-5 Units Subcutaneous QHS   insulin aspart  5 Units Subcutaneous TID WC   [START ON 07/04/2021] insulin glargine-yfgn  22 Units Subcutaneous Daily   mouth rinse  15 mL Mouth Rinse BID   metoprolol tartrate  12.5 mg Oral BID   Continuous Infusions:  dextrose 5% lactated ringers Stopped (07/02/21 1942)   lactated ringers Stopped (07/01/21 1331)     LOS: 3 days    Time spent: 57 MIN  Alwyn Ren, MD  07/03/2021, 3:55 PM

## 2021-07-04 LAB — GLUCOSE, CAPILLARY
Glucose-Capillary: 316 mg/dL — ABNORMAL HIGH (ref 70–99)
Glucose-Capillary: 368 mg/dL — ABNORMAL HIGH (ref 70–99)

## 2021-07-04 LAB — BASIC METABOLIC PANEL
Anion gap: 7 (ref 5–15)
BUN: 17 mg/dL (ref 6–20)
CO2: 25 mmol/L (ref 22–32)
Calcium: 9 mg/dL (ref 8.9–10.3)
Chloride: 103 mmol/L (ref 98–111)
Creatinine, Ser: 0.64 mg/dL (ref 0.44–1.00)
GFR, Estimated: >60 mL/min (ref >60)
Glucose, Bld: 332 mg/dL — ABNORMAL HIGH (ref 70–99)
Potassium: 3.3 mmol/L — ABNORMAL LOW (ref 3.5–5.1)
Sodium: 135 mmol/L (ref 135–145)

## 2021-07-04 LAB — BETA-HYDROXYBUTYRIC ACID: Beta-Hydroxybutyric Acid: 0.46 mmol/L — ABNORMAL HIGH (ref 0.05–0.27)

## 2021-07-04 MED ORDER — POTASSIUM CHLORIDE CRYS ER 20 MEQ PO TBCR
40.0000 meq | EXTENDED_RELEASE_TABLET | Freq: Once | ORAL | Status: AC
  Administered 2021-07-04: 40 meq via ORAL
  Filled 2021-07-04: qty 2

## 2021-07-04 MED ORDER — INSULIN PEN NEEDLE 32G X 4 MM MISC: 25.0000 [IU] | Freq: Every day | 2 refills | Status: DC

## 2021-07-04 MED ORDER — POTASSIUM CHLORIDE 10 MEQ/100ML IV SOLN
10.0000 meq | INTRAVENOUS | Status: DC
  Filled 2021-07-04: qty 100

## 2021-07-04 MED ORDER — BLOOD GLUCOSE METER KIT: PACK | 0 refills | Status: DC

## 2021-07-04 MED ORDER — NOVOLOG FLEXPEN 100 UNIT/ML ~~LOC~~ SOPN: 5.0000 [IU] | PEN_INJECTOR | Freq: Three times a day (TID) | SUBCUTANEOUS | 11 refills | Status: DC

## 2021-07-04 MED ORDER — METOPROLOL TARTRATE 25 MG PO TABS: 12.5000 mg | ORAL_TABLET | Freq: Two times a day (BID) | ORAL | 2 refills | Status: DC

## 2021-07-04 MED ORDER — LANTUS SOLOSTAR 100 UNIT/ML ~~LOC~~ SOPN: 28.0000 [IU] | PEN_INJECTOR | Freq: Every day | SUBCUTANEOUS | 11 refills | Status: DC

## 2021-07-04 MED ORDER — NOVOLOG FLEXPEN 100 UNIT/ML ~~LOC~~ SOPN: 8.0000 [IU] | PEN_INJECTOR | Freq: Three times a day (TID) | SUBCUTANEOUS | 11 refills | Status: DC

## 2021-07-04 NOTE — Progress Notes (Signed)
Inpatient Diabetes Program Recommendations  AACE/ADA: New Consensus Statement on Inpatient Glycemic Control (2015)  Target Ranges:  Prepandial:   less than 140 mg/dL      Peak postprandial:   less than 180 mg/dL (1-2 hours)      Critically ill patients:  140 - 180 mg/dL    Latest Reference Range & Units 07/03/21 07:54 07/03/21 11:55 07/03/21 16:34 07/03/21 21:29  Glucose-Capillary 70 - 99 mg/dL 371 (H)  12 units Novolog  15 units Semglee @0949   [id number] (H)  18 units Novolog  355 (H)  20 units Novolog  5 units Semglee @1501   339 (H)  4 units Novolog     Latest Reference Range & Units 07/04/21 08:44 07/04/21 11:52  Glucose-Capillary 70 - 99 mg/dL 316 (H)  16 units Novolog  22 units Semglee @0919   368 (H)  20 units Novolog     Home DM Meds: Metformin 500 BID (did not get prescription filled on 06/25/21)   Current Orders: Semglee 22 units daily                            Novolog Moderate Correction Scale/ SSI (0-15 units) TID AC + HS                            Novolog 5 units TID with meals      MD- Please consider:  1. Increase Semglee to 25 units Daily (0.35 units/kg)  2. Increase Novolog Meal Coverage to 8 units TID with meals     --Will follow patient during hospitalization--  Wyn Quaker RN, MSN, CDE Diabetes Coordinator Inpatient Glycemic Control Team Team Pager: [phone] (8a-5p)

## 2021-07-04 NOTE — Discharge Summary (Signed)
Physician Discharge Summary  Taylor Hood:[id number] DOB: 1985-09-18 DOA: 06/29/2021  PCP: Benito Mccreedy, MD  Admit date: 06/29/2021 Discharge date: 07/04/2021  Admitted From: home Disposition:  home  Recommendations for Outpatient Follow-up:  Follow up with PCP in 1-2 weeks Please obtain BMP/CBC in one week  Home Health:none Equipment/Devices:none  Discharge Condition:stable CODE STATUS:full code Diet recommendation: carb modified Brief/Interim Summary:    Taylor Hood is a 36 y.o. female with medical history significant of type 2 diabetes, lupus admitted with nausea and vomiting and abdominal pain. She reports she was diagnosed with type 2 diabetes in 2020 was started on insulin and metformin.  However these were stopped due to hypoglycemia 4 months later. She was in the ER on December 25 with blurry vision and she was discharged on metformin which she never picked up apparently she reports she did not have any diabetic supplies to check at home however she did not pick up her prescriptions for metformin.  MRI of the brain at that time showed no acute abnormalities. She has occasional cough but no fever chills dysuria chest pain or shortness of breath.  She had COVID in 2020.  She had not taken any vaccines for COVID. She states that she has a rheumatologist that she follows up for lupus.  However she was not started on any medications since she was breast-feeding her 79-year-old baby.   ED Course: She was started on insulin drip. ABG 7.0 03/28/2023 sodium 127 potassium 5.4 Her anion gap on admission was 18 BUN 31 creatinine 1.32 potassium 3.9 sodium 130. Discharge Diagnoses:  Principal Problem:   DKA (diabetic ketoacidosis) ([place])    #1 DKA due to noncompliance.  No evidence of signs and symptoms of infection.  Urine positive for ketones.  Anion gap on admission was 18  Patient was on insulin drip twice and was taken of with long-acting insulin on board.  Her  blood sugar is starting to trend up again with Semglee on board.   hemoglobin A1c this admission was 10.2. Prescriptions were given for all diabetic supplies Lantus and novolog. She was followed by a diabetic coordinator.   #2 history of lupus follow-up with rheumatology   #3 history of adrenal insufficiency not on any treatments prior to admission.   #4 new onset hypertension and tachycardia started her on metoprolol 12.5 bid.  Estimated body mass index is 26.08 kg/m as calculated from the following:   Height as of this encounter: _0  (1.676 m).   Weight as of this encounter: 73.3 kg.  Discharge Instructions  Discharge Instructions     Diet - low sodium heart healthy   Complete by: As directed    Diet - low sodium heart healthy   Complete by: As directed    Increase activity slowly   Complete by: As directed    Increase activity slowly   Complete by: As directed       Allergies as of 07/04/2021       Reactions   Latex Itching, Rash        Medication List     STOP taking these medications    ibuprofen 200 MG tablet Commonly known as: ADVIL       TAKE these medications    blood glucose meter kit and supplies Dispense based on patient and insurance preference. Use up to four times daily as directed. (FOR ICD-10 E10.9, E11.9).   Insulin Pen Needle 32G X 4 MM Misc 25 Units by Does not  apply route daily.   Lantus SoloStar 100 UNIT/ML Solostar Pen Generic drug: insulin glargine Inject 28 Units into the skin at bedtime.   medroxyPROGESTERone 150 MG/ML injection Commonly known as: DEPO-PROVERA Inject 150 mg into the muscle every 3 (three) months.   metFORMIN 500 MG tablet Commonly known as: GLUCOPHAGE Take 1 tablet (500 mg total) by mouth 2 (two) times daily with a meal.   metoprolol tartrate 25 MG tablet Commonly known as: LOPRESSOR Take 0.5 tablets (12.5 mg total) by mouth 2 (two) times daily.   NovoLOG FlexPen 100 UNIT/ML FlexPen Generic drug:  insulin aspart Inject 8 Units into the skin 3 (three) times daily with meals.        Follow-up Information     Benito Mccreedy, MD Follow up.   Specialty: Internal Medicine Contact information: [address] [place] [address] [phone]                Allergies  Allergen Reactions   Latex Itching and Rash    Consultations: none   Procedures/Studies: DG Chest 1 View  Result Date: 06/29/2021 CLINICAL DATA:  Body aches, hyperglycemia EXAM: CHEST  1 VIEW COMPARISON:  06/26/2021 FINDINGS: Single frontal view of the chest demonstrates an unremarkable cardiac silhouette. No acute airspace disease, effusion, or pneumothorax. No acute bony abnormalities. IMPRESSION: 1. No acute intrathoracic process. Electronically Signed   By: Randa Ngo M.D.   On: 06/29/2021 22:38   DG Chest 2 View  Result Date: 06/26/2021 CLINICAL DATA:  Cough and congestion EXAM: CHEST - 2 VIEW COMPARISON:  07/13/2018 FINDINGS: The heart size and mediastinal contours are within normal limits. Both lungs are clear. The visualized skeletal structures are unremarkable. IMPRESSION: No active cardiopulmonary disease. Electronically Signed   By: Inez Catalina M.D.   On: 06/26/2021 01:07   MR Brain W and Wo Contrast  Result Date: 06/26/2021 CLINICAL DATA:  36 year old female with blurred vision, right eye pain for 5 days. Malaise. EXAM: MRI HEAD AND ORBITS WITHOUT AND WITH CONTRAST TECHNIQUE: Multiplanar, multiecho pulse sequences of the brain and surrounding structures were obtained without and with intravenous contrast. Multiplanar, multiecho pulse sequences of the orbits and surrounding structures were obtained including fat saturation techniques, before and after intravenous contrast administration. CONTRAST:  31m GADAVIST GADOBUTROL 1 MMOL/ML IV SOLN COMPARISON:  Head CT 06/03/2005. FINDINGS: MRI HEAD FINDINGS Brain: Cerebral volume appears stable since 2006 and normal. No restricted  diffusion to suggest acute infarction. No midline shift, mass effect, evidence of mass lesion, ventriculomegaly, extra-axial collection or acute intracranial hemorrhage. Cervicomedullary junction and pituitary are within normal limits. GPearline Cablesand white matter signal is within normal limits for age throughout the brain. No suspicious cerebral white matter signal changes. No cortical encephalomalacia or chronic cerebral blood products. No abnormal enhancement identified. No dural thickening. Vascular: Major intracranial vascular flow voids are preserved. The major dural venous sinuses are enhancing and appear to be patent. Skull and upper cervical spine: Grossly normal visible cervical spine. Conspicuous but benign appearing hemangioma of the left C1 ring (series 23, image 3 and series 30, image 3) and corresponding typical CT appearance of osseous hemangioma there in 2006. Otherwise visualized bone marrow signal is within normal limits. Other: Mastoids are well aerated. Visible internal auditory structures appear normal. But there is proteinaceous fluid opacification of left petrous apex air cells which were clear in 2006. This is conspicuous on postcontrast fat saturated images (series 29, image 9), although no associated diffusion restriction, regional soft tissue  inflammation, marrow edema or other complicating feature identified. Negative visible scalp and face. MRI ORBITS FINDINGS Orbits: Suprasellar cistern is within normal limits. Optic chiasm appears normal. Cavernous sinuses appear symmetric and normal. Optic nerve signal is symmetric and within normal limits. No abnormal enhancement identified. Intra-ocular muscles and other orbits soft tissues appears symmetric and within normal limits. No intraorbital mass or inflammation. Globes appear symmetric and normal. Periorbital soft tissues appears symmetric and normal. Visualized sinuses: Left maxillary sinus mucous retention cyst, otherwise clear. Soft tissues:  Negative visible deep soft tissue spaces of the face. IMPRESSION: 1. Normal MRI appearance of the orbits and brain. 2. Left petrous apex air cell effusion with complex fluid density, but no complicating features. Suspect this is postinflammatory and inconsequential. 3. Benign vertebral hemangioma of the left C1 ring, also incidental. Electronically Signed   By: Genevie Ann M.D.   On: 06/26/2021 06:40   CT Renal Stone Study  Result Date: 06/30/2021 CLINICAL DATA:  Flank pain.  Concern for kidney stone. EXAM: CT ABDOMEN AND PELVIS WITHOUT CONTRAST TECHNIQUE: Multidetector CT imaging of the abdomen and pelvis was performed following the standard protocol without IV contrast. COMPARISON:  None. FINDINGS: Evaluation of this exam is limited in the absence of intravenous contrast. Lower chest: The visualized lung bases are clear. No intra-abdominal free air or free fluid. Hepatobiliary: Fatty liver. No intrahepatic biliary dilatation. The gallbladder is unremarkable. Pancreas: Unremarkable. No pancreatic ductal dilatation or surrounding inflammatory changes. Spleen: Normal in size without focal abnormality. Adrenals/Urinary Tract: The adrenal glands unremarkable. There is a 2 mm nonobstructing right renal upper pole calculus. No hydronephrosis. The left kidney is unremarkable. The visualized ureters and urinary bladder appear unremarkable. Stomach/Bowel: No bowel obstruction or active inflammation. The appendix is normal. Vascular/Lymphatic: The abdominal aorta and IVC are unremarkable. No portal venous gas. There is no adenopathy. Reproductive: The uterus is anteverted and grossly unremarkable. No adnexal masses. Other: There is diastasis of anterior abdominal wall musculature at the level of the umbilicus. Anterior pelvic C-section scar. Musculoskeletal: No acute or significant osseous findings. IMPRESSION: 1. A 2 mm nonobstructing right renal upper pole calculus. No hydronephrosis. 2. Fatty liver. 3. No bowel  obstruction. Normal appendix. Electronically Signed   By: Anner Crete M.D.   On: 06/30/2021 00:17   MR ORBITS W WO CONTRAST  Result Date: 06/26/2021 CLINICAL DATA:  36 year old female with blurred vision, right eye pain for 5 days. Malaise. EXAM: MRI HEAD AND ORBITS WITHOUT AND WITH CONTRAST TECHNIQUE: Multiplanar, multiecho pulse sequences of the brain and surrounding structures were obtained without and with intravenous contrast. Multiplanar, multiecho pulse sequences of the orbits and surrounding structures were obtained including fat saturation techniques, before and after intravenous contrast administration. CONTRAST:  66m GADAVIST GADOBUTROL 1 MMOL/ML IV SOLN COMPARISON:  Head CT 06/03/2005. FINDINGS: MRI HEAD FINDINGS Brain: Cerebral volume appears stable since 2006 and normal. No restricted diffusion to suggest acute infarction. No midline shift, mass effect, evidence of mass lesion, ventriculomegaly, extra-axial collection or acute intracranial hemorrhage. Cervicomedullary junction and pituitary are within normal limits. GPearline Cablesand white matter signal is within normal limits for age throughout the brain. No suspicious cerebral white matter signal changes. No cortical encephalomalacia or chronic cerebral blood products. No abnormal enhancement identified. No dural thickening. Vascular: Major intracranial vascular flow voids are preserved. The major dural venous sinuses are enhancing and appear to be patent. Skull and upper cervical spine: Grossly normal visible cervical spine. Conspicuous but benign appearing hemangioma of the left C1 ring (  series 23, image 3 and series 30, image 3) and corresponding typical CT appearance of osseous hemangioma there in 2006. Otherwise visualized bone marrow signal is within normal limits. Other: Mastoids are well aerated. Visible internal auditory structures appear normal. But there is proteinaceous fluid opacification of left petrous apex air cells which were clear  in 2006. This is conspicuous on postcontrast fat saturated images (series 29, image 9), although no associated diffusion restriction, regional soft tissue inflammation, marrow edema or other complicating feature identified. Negative visible scalp and face. MRI ORBITS FINDINGS Orbits: Suprasellar cistern is within normal limits. Optic chiasm appears normal. Cavernous sinuses appear symmetric and normal. Optic nerve signal is symmetric and within normal limits. No abnormal enhancement identified. Intra-ocular muscles and other orbits soft tissues appears symmetric and within normal limits. No intraorbital mass or inflammation. Globes appear symmetric and normal. Periorbital soft tissues appears symmetric and normal. Visualized sinuses: Left maxillary sinus mucous retention cyst, otherwise clear. Soft tissues: Negative visible deep soft tissue spaces of the face. IMPRESSION: 1. Normal MRI appearance of the orbits and brain. 2. Left petrous apex air cell effusion with complex fluid density, but no complicating features. Suspect this is postinflammatory and inconsequential. 3. Benign vertebral hemangioma of the left C1 ring, also incidental. Electronically Signed   By: Genevie Ann M.D.   On: 06/26/2021 06:40   (Echo, Carotid, EGD, Colonoscopy, ERCP)    Subjective: No complaints tolerating PO intake  Discharge Exam: Vitals:   07/04/21 1145 07/04/21 1235  BP:    Pulse: 78 92  Resp: 15 18  Temp: 97.8 F (36.6 C)   SpO2: 99% 97%   Vitals:   07/04/21 0900 07/04/21 1000 07/04/21 1145 07/04/21 1235  BP: (!) 139/112 140/82    Pulse: 85 84 78 92  Resp: _0 Temp:   97.8 F (36.6 C)   TempSrc:   Oral   SpO2: 100% 99% 99% 97%  Weight:      Height:        General: Pt is alert, awake, not in acute distress Cardiovascular: RRR, S1/S2 +, no rubs, no gallops Respiratory: CTA bilaterally, no wheezing, no rhonchi Abdominal: Soft, NT, ND, bowel sounds + Extremities: no edema, no cyanosis    The  results of significant diagnostics from this hospitalization (including imaging, microbiology, ancillary and laboratory) are listed below for reference.     Microbiology: Recent Results (from the past 240 hour(s))  Resp Panel by RT-PCR (Flu A&B, Covid) Nasopharyngeal Swab     Status: None   Collection Time: 06/25/21 11:55 PM   Specimen: Nasopharyngeal Swab; Nasopharyngeal(NP) swabs in vial transport medium  Result Value Ref Range Status   SARS Coronavirus 2 by RT PCR NEGATIVE NEGATIVE Final    Comment: (NOTE) SARS-CoV-2 target nucleic acids are NOT DETECTED.  The SARS-CoV-2 RNA is generally detectable in upper respiratory specimens during the acute phase of infection. The lowest concentration of SARS-CoV-2 viral copies this assay can detect is 138 copies/mL. A negative result does not preclude SARS-Cov-2 infection and should not be used as the sole basis for treatment or other patient management decisions. A negative result may occur with  improper specimen collection/handling, submission of specimen other than nasopharyngeal swab, presence of viral mutation(s) within the areas targeted by this assay, and inadequate number of viral copies(<138 copies/mL). A negative result must be combined with clinical observations, patient history, and epidemiological information. The expected result is Negative.  Fact Sheet for Patients:  EntrepreneurPulse.com.au  Fact  Sheet for Healthcare Providers:  IncredibleEmployment.be  This test is no t yet approved or cleared by the [place] FDA and  has been authorized for detection and/or diagnosis of SARS-CoV-2 by FDA under an Emergency Use Authorization (EUA). This EUA will remain  in effect (meaning this test can be used) for the duration of the COVID-19 declaration under Section 564(b)(1) of the Act, 21 U.S.C.section 360bbb-3(b)(1), unless the authorization is terminated  or revoked sooner.        Influenza A by PCR NEGATIVE NEGATIVE Final   Influenza B by PCR NEGATIVE NEGATIVE Final    Comment: (NOTE) The Xpert Xpress SARS-CoV-2/FLU/RSV plus assay is intended as an aid in the diagnosis of influenza from Nasopharyngeal swab specimens and should not be used as a sole basis for treatment. Nasal washings and aspirates are unacceptable for Xpert Xpress SARS-CoV-2/FLU/RSV testing.  Fact Sheet for Patients: EntrepreneurPulse.com.au  Fact Sheet for Healthcare Providers: IncredibleEmployment.be  This test is not yet approved or cleared by the [place] FDA and has been authorized for detection and/or diagnosis of SARS-CoV-2 by FDA under an Emergency Use Authorization (EUA). This EUA will remain in effect (meaning this test can be used) for the duration of the COVID-19 declaration under Section 564(b)(1) of the Act, 21 U.S.C. section 360bbb-3(b)(1), unless the authorization is terminated or revoked.  Performed at [facility], [place] [address], [place], [place] [address]   Resp Panel by RT-PCR (Flu A&B, Covid) Nasopharyngeal Swab     Status: None   Collection Time: 06/29/21 10:38 PM   Specimen: Nasopharyngeal Swab; Nasopharyngeal(NP) swabs in vial transport medium  Result Value Ref Range Status   SARS Coronavirus 2 by RT PCR NEGATIVE NEGATIVE Final    Comment: (NOTE) SARS-CoV-2 target nucleic acids are NOT DETECTED.  The SARS-CoV-2 RNA is generally detectable in upper respiratory specimens during the acute phase of infection. The lowest concentration of SARS-CoV-2 viral copies this assay can detect is 138 copies/mL. A negative result does not preclude SARS-Cov-2 infection and should not be used as the sole basis for treatment or other patient management decisions. A negative result may occur with  improper specimen collection/handling, submission of specimen other than nasopharyngeal swab, presence of viral mutation(s) within  the areas targeted by this assay, and inadequate number of viral copies(<138 copies/mL). A negative result must be combined with clinical observations, patient history, and epidemiological information. The expected result is Negative.  Fact Sheet for Patients:  EntrepreneurPulse.com.au  Fact Sheet for Healthcare Providers:  IncredibleEmployment.be  This test is no t yet approved or cleared by the [place] FDA and  has been authorized for detection and/or diagnosis of SARS-CoV-2 by FDA under an Emergency Use Authorization (EUA). This EUA will remain  in effect (meaning this test can be used) for the duration of the COVID-19 declaration under Section 564(b)(1) of the Act, 21 U.S.C.section 360bbb-3(b)(1), unless the authorization is terminated  or revoked sooner.       Influenza A by PCR NEGATIVE NEGATIVE Final   Influenza B by PCR NEGATIVE NEGATIVE Final    Comment: (NOTE) The Xpert Xpress SARS-CoV-2/FLU/RSV plus assay is intended as an aid in the diagnosis of influenza from Nasopharyngeal swab specimens and should not be used as a sole basis for treatment. Nasal washings and aspirates are unacceptable for Xpert Xpress SARS-CoV-2/FLU/RSV testing.  Fact Sheet for Patients: EntrepreneurPulse.com.au  Fact Sheet for Healthcare Providers: IncredibleEmployment.be  This test is not yet approved or cleared by the [place] FDA  and has been authorized for detection and/or diagnosis of SARS-CoV-2 by FDA under an Emergency Use Authorization (EUA). This EUA will remain in effect (meaning this test can be used) for the duration of the COVID-19 declaration under Section 564(b)(1) of the Act, 21 U.S.C. section 360bbb-3(b)(1), unless the authorization is terminated or revoked.  Performed at [facility], [address] [place], [place] [address]   MRSA Next Gen by PCR, Nasal     Status: None    Collection Time: 06/30/21  2:00 PM   Specimen: Nasal Mucosa; Nasal Swab  Result Value Ref Range Status   MRSA by PCR Next Gen NOT DETECTED NOT DETECTED Final    Comment: (NOTE) The GeneXpert MRSA Assay (FDA approved for NASAL specimens only), is one component of a comprehensive MRSA colonization surveillance program. It is not intended to diagnose MRSA infection nor to guide or monitor treatment for MRSA infections. Test performance is not FDA approved in patients less than 56 years old. Performed at [facility], [place] [address], [place], [place] [address]      Labs: BNP (last 3 results) No results for input(s): BNP in the last 8760 hours. Basic Metabolic Panel: Recent Labs  Lab 07/02/21 0244 07/02/21 1327 07/03/21 0734 07/03/21 1645 07/04/21 0250  NA 136 135 135 135 135  K 2.8* 2.8* 3.9 3.3* 3.3*  CL 104 100 102 102 103  CO2 21* _0 GLUCOSE 118* 209* 390* 343* 332*  BUN _1 CREATININE 0.53 0.60 0.66 0.75 0.64  CALCIUM 9.0 9.0 9.1 9.4 9.0  MG  --   --  1.9  --   --    Liver Function Tests: No results for input(s): AST, ALT, ALKPHOS, BILITOT, PROT, ALBUMIN in the last 168 hours. No results for input(s): LIPASE, AMYLASE in the last 168 hours. No results for input(s): AMMONIA in the last 168 hours. CBC: Recent Labs  Lab 06/29/21 2111 06/29/21 2250  WBC 10.7*  --   HGB 15.5* 17.3*  HCT 46.9* 51.0*  MCV 91.1  --   PLT 264  --    Cardiac Enzymes: No results for input(s): CKTOTAL, CKMB, CKMBINDEX, TROPONINI in the last 168 hours. BNP: Invalid input(s): POCBNP CBG: Recent Labs  Lab 07/03/21 1155 07/03/21 1634 07/03/21 2129 07/04/21 0844 07/04/21 1152  GLUCAP 400* 355* 339* 316* 368*   D-Dimer No results for input(s): DDIMER in the last 72 hours. Hgb A1c No results for input(s): HGBA1C in the last 72 hours. Lipid Profile No results for input(s): CHOL, HDL, LDLCALC, TRIG, CHOLHDL, LDLDIRECT in the last 72  hours. Thyroid function studies No results for input(s): TSH, T4TOTAL, T3FREE, THYROIDAB in the last 72 hours.  Invalid input(s): FREET3 Anemia work up No results for input(s): VITAMINB12, FOLATE, FERRITIN, TIBC, IRON, RETICCTPCT in the last 72 hours. Urinalysis    Component Value Date/Time   COLORURINE YELLOW 06/30/2021 0029   APPEARANCEUR HAZY (A) 06/30/2021 0029   LABSPEC 1.025 06/30/2021 0029   PHURINE 5.5 06/30/2021 0029   GLUCOSEU >=500 (A) 06/30/2021 0029   HGBUR LARGE (A) 06/30/2021 0029   BILIRUBINUR NEGATIVE 06/30/2021 0029   KETONESUR >=80 (A) 06/30/2021 0029   PROTEINUR 100 (A) 06/30/2021 0029   UROBILINOGEN 1.0 10/28/2013 2240   NITRITE NEGATIVE 06/30/2021 0029   LEUKOCYTESUR NEGATIVE 06/30/2021 0029   Sepsis Labs Invalid input(s): PROCALCITONIN,  WBC,  LACTICIDVEN Microbiology Recent Results (from the past 240 hour(s))  Resp Panel by  RT-PCR (Flu A&B, Covid) Nasopharyngeal Swab     Status: None   Collection Time: 06/25/21 11:55 PM   Specimen: Nasopharyngeal Swab; Nasopharyngeal(NP) swabs in vial transport medium  Result Value Ref Range Status   SARS Coronavirus 2 by RT PCR NEGATIVE NEGATIVE Final    Comment: (NOTE) SARS-CoV-2 target nucleic acids are NOT DETECTED.  The SARS-CoV-2 RNA is generally detectable in upper respiratory specimens during the acute phase of infection. The lowest concentration of SARS-CoV-2 viral copies this assay can detect is 138 copies/mL. A negative result does not preclude SARS-Cov-2 infection and should not be used as the sole basis for treatment or other patient management decisions. A negative result may occur with  improper specimen collection/handling, submission of specimen other than nasopharyngeal swab, presence of viral mutation(s) within the areas targeted by this assay, and inadequate number of viral copies(<138 copies/mL). A negative result must be combined with clinical observations, patient history, and  epidemiological information. The expected result is Negative.  Fact Sheet for Patients:  EntrepreneurPulse.com.au  Fact Sheet for Healthcare Providers:  IncredibleEmployment.be  This test is no t yet approved or cleared by the [place] FDA and  has been authorized for detection and/or diagnosis of SARS-CoV-2 by FDA under an Emergency Use Authorization (EUA). This EUA will remain  in effect (meaning this test can be used) for the duration of the COVID-19 declaration under Section 564(b)(1) of the Act, 21 U.S.C.section 360bbb-3(b)(1), unless the authorization is terminated  or revoked sooner.       Influenza A by PCR NEGATIVE NEGATIVE Final   Influenza B by PCR NEGATIVE NEGATIVE Final    Comment: (NOTE) The Xpert Xpress SARS-CoV-2/FLU/RSV plus assay is intended as an aid in the diagnosis of influenza from Nasopharyngeal swab specimens and should not be used as a sole basis for treatment. Nasal washings and aspirates are unacceptable for Xpert Xpress SARS-CoV-2/FLU/RSV testing.  Fact Sheet for Patients: EntrepreneurPulse.com.au  Fact Sheet for Healthcare Providers: IncredibleEmployment.be  This test is not yet approved or cleared by the [place] FDA and has been authorized for detection and/or diagnosis of SARS-CoV-2 by FDA under an Emergency Use Authorization (EUA). This EUA will remain in effect (meaning this test can be used) for the duration of the COVID-19 declaration under Section 564(b)(1) of the Act, 21 U.S.C. section 360bbb-3(b)(1), unless the authorization is terminated or revoked.  Performed at [facility], [place] [address] [place], [place] [address]   Resp Panel by RT-PCR (Flu A&B, Covid) Nasopharyngeal Swab     Status: None   Collection Time: 06/29/21 10:38 PM   Specimen: Nasopharyngeal Swab; Nasopharyngeal(NP) swabs in vial transport medium  Result Value Ref Range Status    SARS Coronavirus 2 by RT PCR NEGATIVE NEGATIVE Final    Comment: (NOTE) SARS-CoV-2 target nucleic acids are NOT DETECTED.  The SARS-CoV-2 RNA is generally detectable in upper respiratory specimens during the acute phase of infection. The lowest concentration of SARS-CoV-2 viral copies this assay can detect is 138 copies/mL. A negative result does not preclude SARS-Cov-2 infection and should not be used as the sole basis for treatment or other patient management decisions. A negative result may occur with  improper specimen collection/handling, submission of specimen other than nasopharyngeal swab, presence of viral mutation(s) within the areas targeted by this assay, and inadequate number of viral copies(<138 copies/mL). A negative result must be combined with clinical observations, patient history, and epidemiological information. The expected result is Negative.  Fact Sheet for Patients:  EntrepreneurPulse.com.au  Fact Sheet for Healthcare Providers:  IncredibleEmployment.be  This test is no t yet approved or cleared by the [place] FDA and  has been authorized for detection and/or diagnosis of SARS-CoV-2 by FDA under an Emergency Use Authorization (EUA). This EUA will remain  in effect (meaning this test can be used) for the duration of the COVID-19 declaration under Section 564(b)(1) of the Act, 21 U.S.C.section 360bbb-3(b)(1), unless the authorization is terminated  or revoked sooner.       Influenza A by PCR NEGATIVE NEGATIVE Final   Influenza B by PCR NEGATIVE NEGATIVE Final    Comment: (NOTE) The Xpert Xpress SARS-CoV-2/FLU/RSV plus assay is intended as an aid in the diagnosis of influenza from Nasopharyngeal swab specimens and should not be used as a sole basis for treatment. Nasal washings and aspirates are unacceptable for Xpert Xpress SARS-CoV-2/FLU/RSV testing.  Fact Sheet for  Patients: EntrepreneurPulse.com.au  Fact Sheet for Healthcare Providers: IncredibleEmployment.be  This test is not yet approved or cleared by the [place] FDA and has been authorized for detection and/or diagnosis of SARS-CoV-2 by FDA under an Emergency Use Authorization (EUA). This EUA will remain in effect (meaning this test can be used) for the duration of the COVID-19 declaration under Section 564(b)(1) of the Act, 21 U.S.C. section 360bbb-3(b)(1), unless the authorization is terminated or revoked.  Performed at [facility], [place] [place], [place] [address]   MRSA Next Gen by PCR, Nasal     Status: None   Collection Time: 06/30/21  2:00 PM   Specimen: Nasal Mucosa; Nasal Swab  Result Value Ref Range Status   MRSA by PCR Next Gen NOT DETECTED NOT DETECTED Final    Comment: (NOTE) The GeneXpert MRSA Assay (FDA approved for NASAL specimens only), is one component of a comprehensive MRSA colonization surveillance program. It is not intended to diagnose MRSA infection nor to guide or monitor treatment for MRSA infections. Test performance is not FDA approved in patients less than 50 years old. Performed at [facility], [place] [address] [place], [place] [address]      Time coordinating discharge:  37 minutes  SIGNED:   Georgette Shell, MD  [facility] 07/04/2021, 5:24 PM

## 2021-08-02 DIAGNOSIS — Z419 Encounter for procedure for purposes other than remedying health state, unspecified: Secondary | ICD-10-CM | POA: Diagnosis not present

## 2021-08-17 DIAGNOSIS — R200 Anesthesia of skin: Secondary | ICD-10-CM | POA: Diagnosis not present

## 2021-08-17 DIAGNOSIS — M545 Low back pain, unspecified: Secondary | ICD-10-CM | POA: Diagnosis not present

## 2021-08-18 DIAGNOSIS — R109 Unspecified abdominal pain: Secondary | ICD-10-CM | POA: Diagnosis not present

## 2021-08-18 DIAGNOSIS — K760 Fatty (change of) liver, not elsewhere classified: Secondary | ICD-10-CM | POA: Diagnosis not present

## 2021-08-30 DIAGNOSIS — Z419 Encounter for procedure for purposes other than remedying health state, unspecified: Secondary | ICD-10-CM | POA: Diagnosis not present

## 2021-09-30 DIAGNOSIS — Z419 Encounter for procedure for purposes other than remedying health state, unspecified: Secondary | ICD-10-CM | POA: Diagnosis not present

## 2021-10-13 ENCOUNTER — Encounter (HOSPITAL_COMMUNITY): Payer: Self-pay

## 2021-10-13 ENCOUNTER — Emergency Department (HOSPITAL_COMMUNITY): Payer: BLUE CROSS/BLUE SHIELD

## 2021-10-13 ENCOUNTER — Emergency Department (HOSPITAL_COMMUNITY)
Admission: EM | Admit: 2021-10-13 | Discharge: 2021-10-13 | Disposition: A | Discharge status: Home / Self Care | Payer: BLUE CROSS/BLUE SHIELD | Attending: Emergency Medicine | Admitting: Emergency Medicine

## 2021-10-13 DIAGNOSIS — M546 Pain in thoracic spine: Secondary | ICD-10-CM | POA: Diagnosis not present

## 2021-10-13 DIAGNOSIS — Y92410 Unspecified street and highway as the place of occurrence of the external cause: Secondary | ICD-10-CM | POA: Diagnosis not present

## 2021-10-13 DIAGNOSIS — M542 Cervicalgia: Secondary | ICD-10-CM | POA: Diagnosis present

## 2021-10-13 DIAGNOSIS — R079 Chest pain, unspecified: Secondary | ICD-10-CM | POA: Diagnosis not present

## 2021-10-13 DIAGNOSIS — M545 Low back pain, unspecified: Secondary | ICD-10-CM | POA: Insufficient documentation

## 2021-10-13 DIAGNOSIS — Z91040 Latex allergy status: Secondary | ICD-10-CM | POA: Diagnosis not present

## 2021-10-13 DIAGNOSIS — R519 Headache, unspecified: Secondary | ICD-10-CM | POA: Diagnosis not present

## 2021-10-13 LAB — POC URINE PREG, ED: Preg Test, Ur: NEGATIVE

## 2021-10-13 MED ORDER — CYCLOBENZAPRINE HCL 10 MG PO TABS: 10.0000 mg | ORAL_TABLET | Freq: Every evening | ORAL | 0 refills | Status: DC | PRN

## 2021-10-13 NOTE — Discharge Instructions (Addendum)
Please take Ibuprofen (Advil, motrin) and Tylenol (acetaminophen) to relieve your pain.    You may take up to 600 MG (3 pills) of normal strength ibuprofen every 8 hours as needed.   You make take tylenol, up to 1,000 mg (two extra strength pills) every 8 hours as needed.   It is safe to take ibuprofen and tylenol at the same time as they work differently.   Do not take more than 3,000 mg tylenol in a 24 hour period (not more than one dose every 8 hours.  Please check all medication labels as many medications such as pain and cold medications may contain tylenol.  Do not drink alcohol while taking these medications.  Do not take other NSAID'S while taking ibuprofen (such as aleve or naproxen).  Please take ibuprofen with food to decrease stomach upset.  Today you received medications that may make you sleepy or impair your ability to make decisions.  For the next 24 hours please do not drive, operate heavy machinery, care for a small child with out another adult present, or perform any activities that may cause harm to you or someone else if you were to fall asleep or be impaired.   You are being prescribed a medication which may make you sleepy. Please follow up of listed precautions for at least 24 hours after taking one dose.  
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
## 2021-10-30 DIAGNOSIS — Z419 Encounter for procedure for purposes other than remedying health state, unspecified: Secondary | ICD-10-CM | POA: Diagnosis not present

## 2021-11-30 DIAGNOSIS — Z419 Encounter for procedure for purposes other than remedying health state, unspecified: Secondary | ICD-10-CM | POA: Diagnosis not present

## 2021-12-30 DIAGNOSIS — Z419 Encounter for procedure for purposes other than remedying health state, unspecified: Secondary | ICD-10-CM | POA: Diagnosis not present

## 2022-01-30 DIAGNOSIS — Z419 Encounter for procedure for purposes other than remedying health state, unspecified: Secondary | ICD-10-CM | POA: Diagnosis not present

## 2022-03-02 DIAGNOSIS — Z419 Encounter for procedure for purposes other than remedying health state, unspecified: Secondary | ICD-10-CM | POA: Diagnosis not present

## 2022-04-01 DIAGNOSIS — Z419 Encounter for procedure for purposes other than remedying health state, unspecified: Secondary | ICD-10-CM | POA: Diagnosis not present

## 2022-06-01 DIAGNOSIS — Z419 Encounter for procedure for purposes other than remedying health state, unspecified: Secondary | ICD-10-CM | POA: Diagnosis not present

## 2022-07-02 DIAGNOSIS — Z419 Encounter for procedure for purposes other than remedying health state, unspecified: Secondary | ICD-10-CM | POA: Diagnosis not present

## 2022-08-02 DIAGNOSIS — Z419 Encounter for procedure for purposes other than remedying health state, unspecified: Secondary | ICD-10-CM | POA: Diagnosis not present

## 2022-08-31 DIAGNOSIS — Z419 Encounter for procedure for purposes other than remedying health state, unspecified: Secondary | ICD-10-CM | POA: Diagnosis not present

## 2022-10-01 DIAGNOSIS — Z419 Encounter for procedure for purposes other than remedying health state, unspecified: Secondary | ICD-10-CM | POA: Diagnosis not present

## 2022-10-10 DIAGNOSIS — Z3042 Encounter for surveillance of injectable contraceptive: Secondary | ICD-10-CM | POA: Diagnosis not present

## 2022-10-16 IMAGING — DX DG CHEST 1V
1 series · 1 of 1 positions shown · non-contrast
Comparison: 06/26/2021

CLINICAL DATA: Body aches, hyperglycemia

EXAM:
CHEST  1 VIEW

[chest ap]
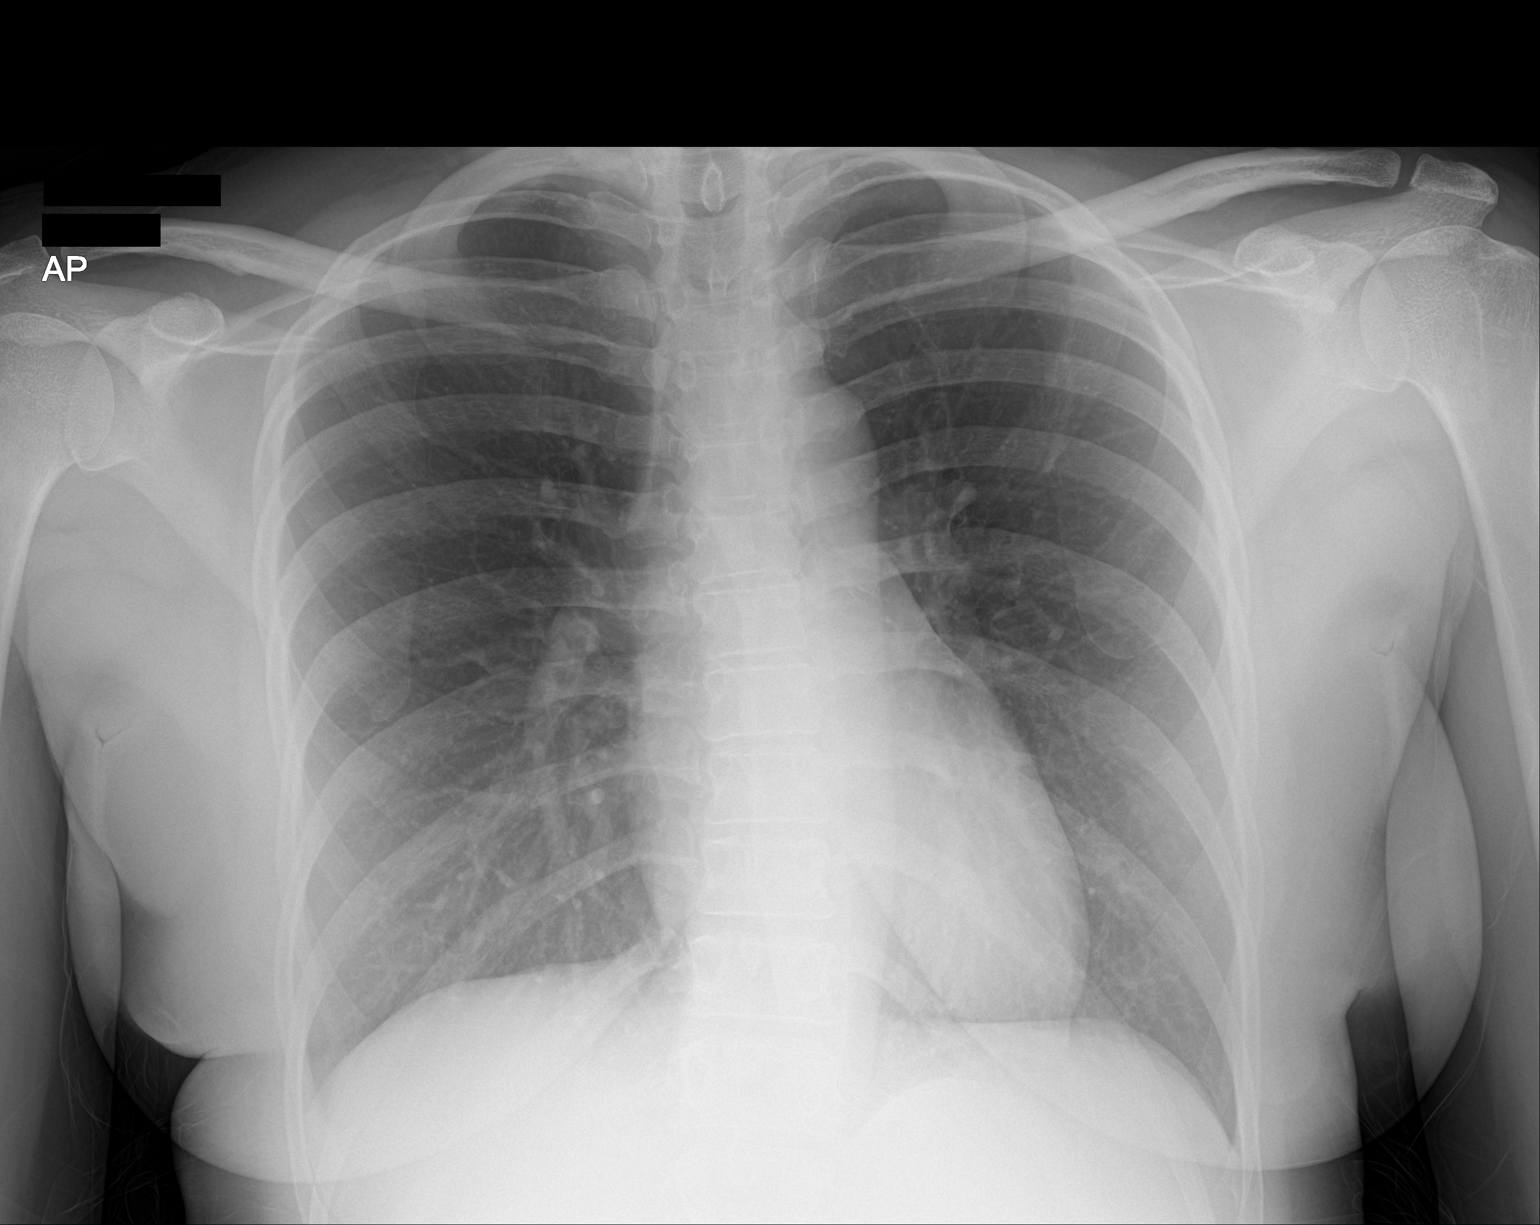

[1 of 1 positions shown; findings below may reference images not displayed]

FINDINGS: Single frontal view of the chest demonstrates an unremarkable
cardiac silhouette. No acute airspace disease, effusion, or
pneumothorax. No acute bony abnormalities.
IMPRESSION: 1. No acute intrathoracic process.

## 2022-10-17 IMAGING — CT CT RENAL STONE PROTOCOL
2 of 4 series · 16 of 46 positions shown, 18 images · non-contrast
Comparison: None.

CLINICAL DATA: Flank pain.  Concern for kidney stone.

EXAM:
CT ABDOMEN AND PELVIS WITHOUT CONTRAST
TECHNIQUE: Multidetector CT imaging of the abdomen and pelvis was performed
following the standard protocol without IV contrast.

[Series 2: axial st · axial · 0.89mm/px · z∈[-620,-215]mm · 13 of 89 slices shown, 15 images]
[im 4/89  soft-tissue]
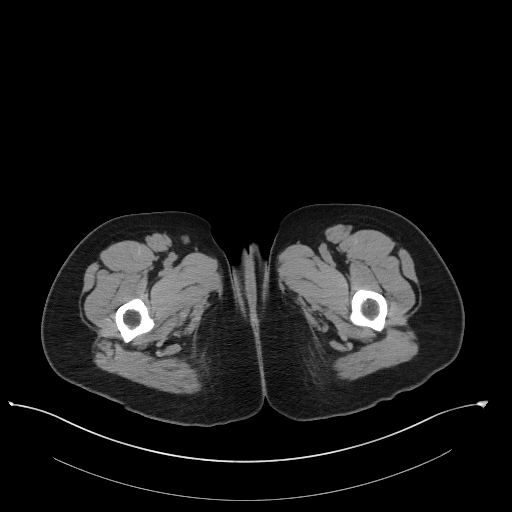
[im 4/89  bone]
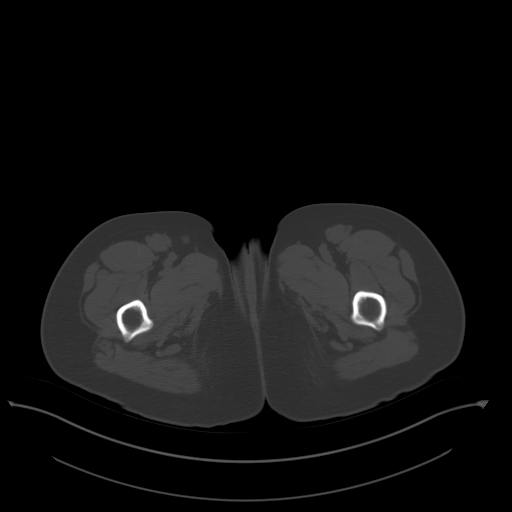
[im 11/89  soft-tissue]
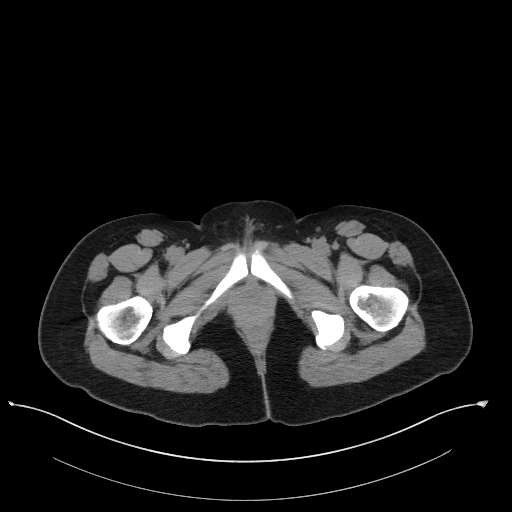
[im 17/89  soft-tissue]
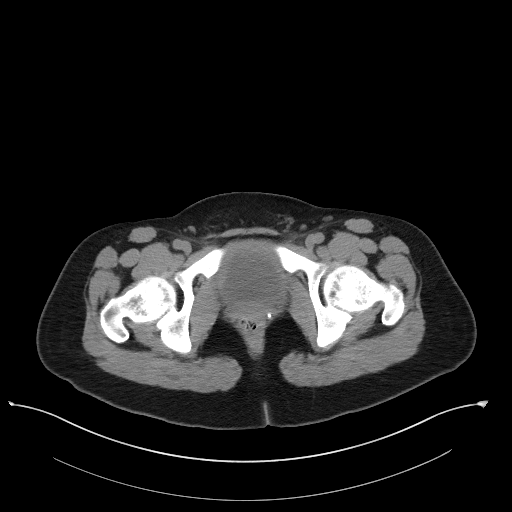
[im 24/89  soft-tissue]
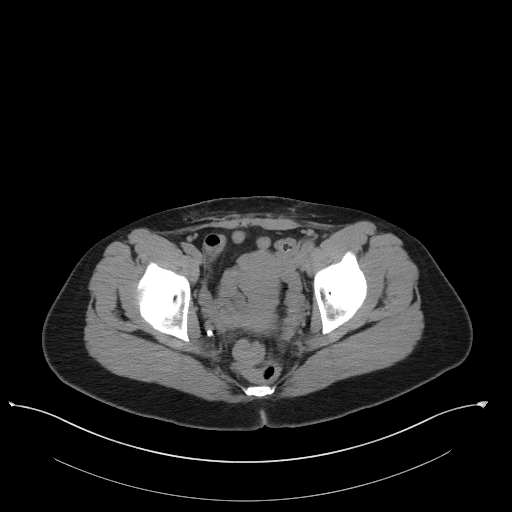
[im 31/89  soft-tissue]
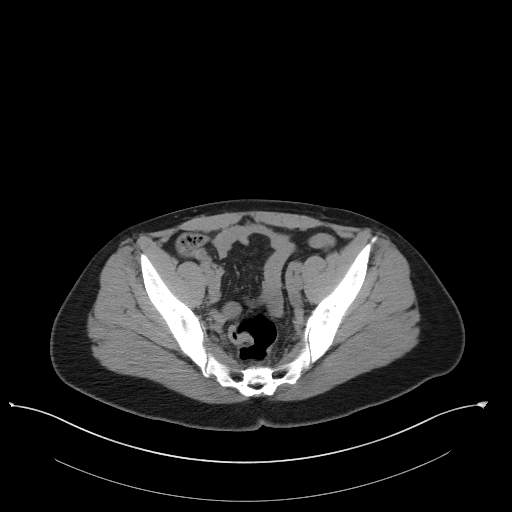
[im 38/89  soft-tissue]
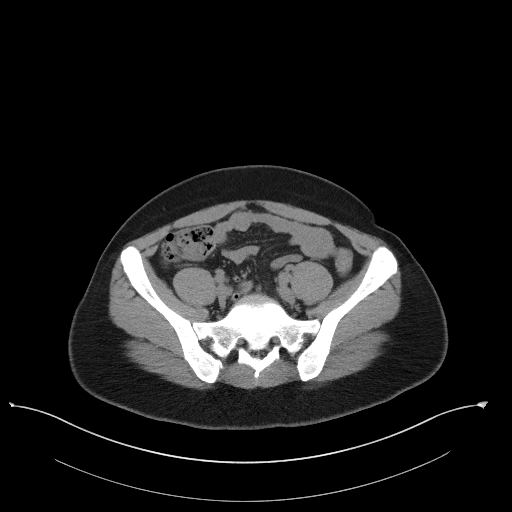
[im 45/89  soft-tissue]
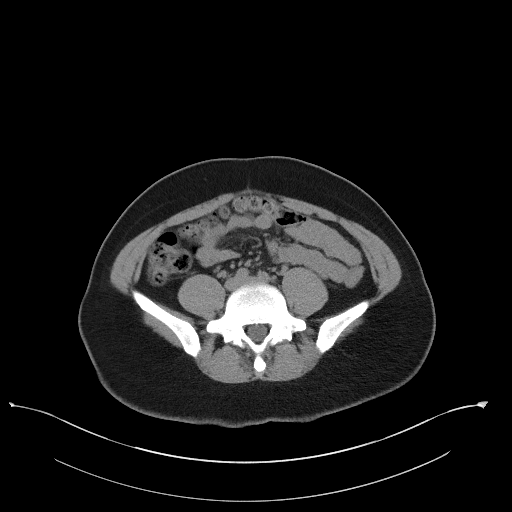
[im 51/89  soft-tissue]
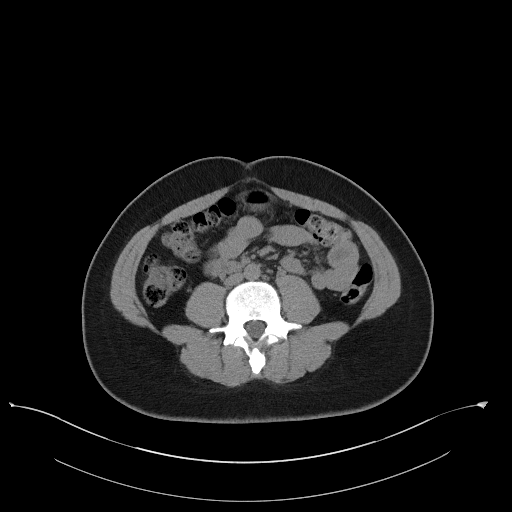
[im 58/89  soft-tissue]
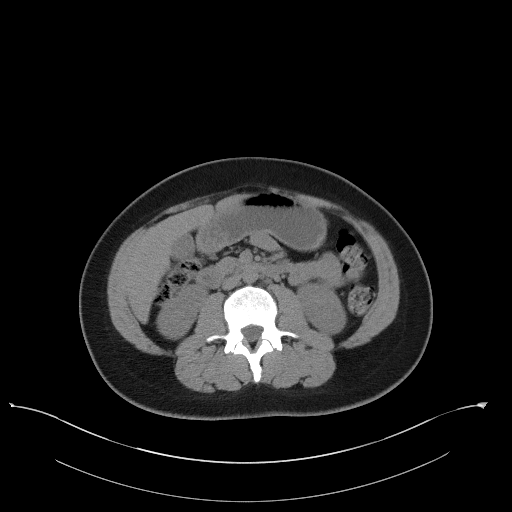
[im 58/89  bone]
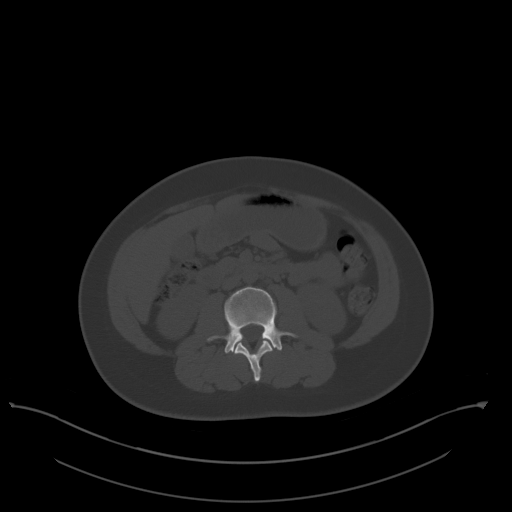
[im 65/89  soft-tissue]
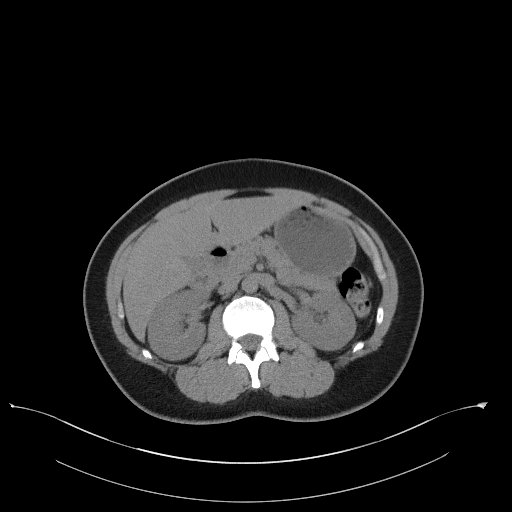
[im 72/89  soft-tissue]
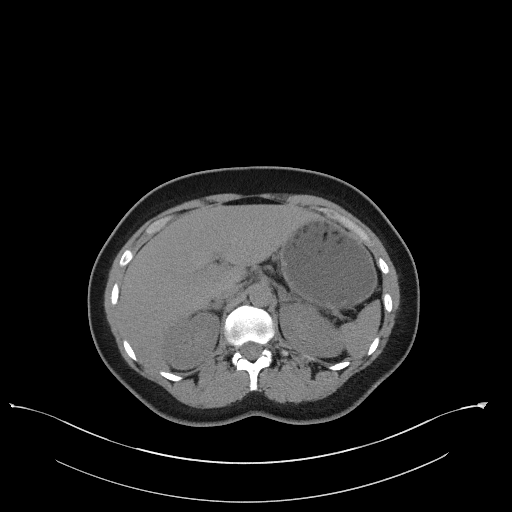
[im 78/89  soft-tissue]
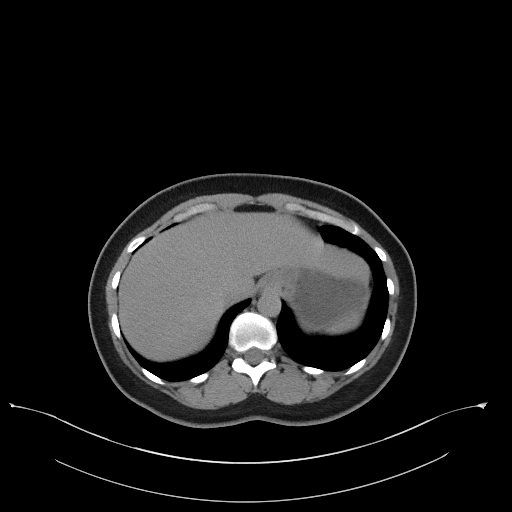
[im 85/89  soft-tissue]
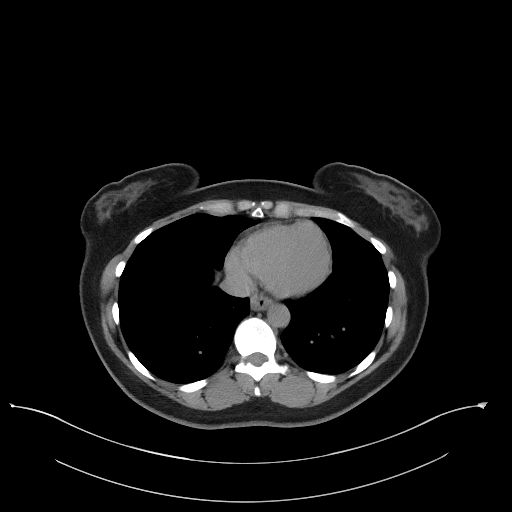

[Series 5: coronal st · coronal · 0.71mm/px · 3 of 78 slices shown]
[im 26/78  soft-tissue]
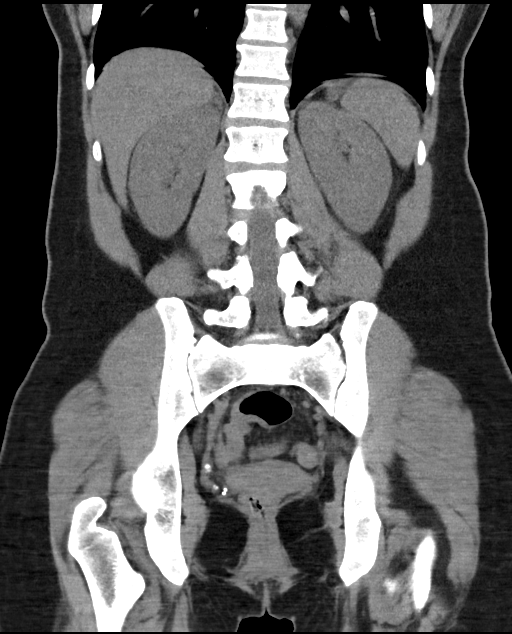
[im 35/78  soft-tissue]
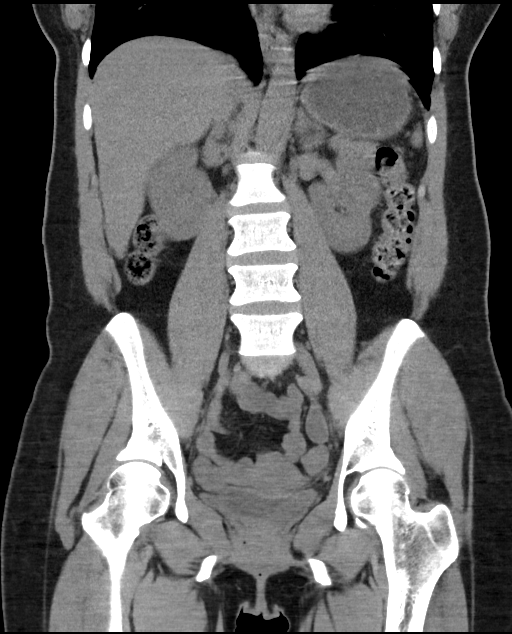
[im 43/78  soft-tissue]
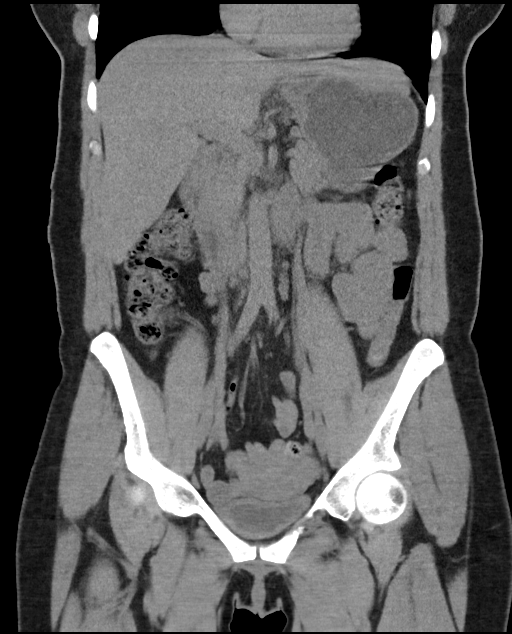

[16 of 46 positions shown; findings below may reference images not displayed]

FINDINGS: Evaluation of this exam is limited in the absence of intravenous
contrast.

Lower chest: The visualized lung bases are clear.

No intra-abdominal free air or free fluid.

Hepatobiliary: Fatty liver. No intrahepatic biliary dilatation. The
gallbladder is unremarkable.

Pancreas: Unremarkable. No pancreatic ductal dilatation or
surrounding inflammatory changes.

Spleen: Normal in size without focal abnormality.

Adrenals/Urinary Tract: The adrenal glands unremarkable. There is a
2 mm nonobstructing right renal upper pole calculus. No
hydronephrosis. The left kidney is unremarkable. The visualized
ureters and urinary bladder appear unremarkable.

Stomach/Bowel: No bowel obstruction or active inflammation. The
appendix is normal.

Vascular/Lymphatic: The abdominal aorta and IVC are unremarkable. No
portal venous gas. There is no adenopathy.

Reproductive: The uterus is anteverted and grossly unremarkable. No
adnexal masses.

Other: There is diastasis of anterior abdominal wall musculature at
the level of the umbilicus. Anterior pelvic C-section scar.

Musculoskeletal: No acute or significant osseous findings.
IMPRESSION: 1. A 2 mm nonobstructing right renal upper pole calculus. No
hydronephrosis.
2. Fatty liver.
3. No bowel obstruction. Normal appendix.

## 2022-10-22 DIAGNOSIS — I10 Essential (primary) hypertension: Secondary | ICD-10-CM | POA: Diagnosis not present

## 2022-10-22 DIAGNOSIS — E119 Type 2 diabetes mellitus without complications: Secondary | ICD-10-CM | POA: Diagnosis not present

## 2022-10-22 DIAGNOSIS — Z794 Long term (current) use of insulin: Secondary | ICD-10-CM | POA: Diagnosis not present

## 2022-10-22 DIAGNOSIS — E782 Mixed hyperlipidemia: Secondary | ICD-10-CM | POA: Diagnosis not present

## 2022-10-31 DIAGNOSIS — Z419 Encounter for procedure for purposes other than remedying health state, unspecified: Secondary | ICD-10-CM | POA: Diagnosis not present

## 2022-12-01 DIAGNOSIS — Z419 Encounter for procedure for purposes other than remedying health state, unspecified: Secondary | ICD-10-CM | POA: Diagnosis not present

## 2022-12-31 DIAGNOSIS — Z3042 Encounter for surveillance of injectable contraceptive: Secondary | ICD-10-CM | POA: Diagnosis not present

## 2022-12-31 DIAGNOSIS — Z419 Encounter for procedure for purposes other than remedying health state, unspecified: Secondary | ICD-10-CM | POA: Diagnosis not present

## 2023-01-11 DIAGNOSIS — N644 Mastodynia: Secondary | ICD-10-CM | POA: Diagnosis not present

## 2023-01-11 DIAGNOSIS — I10 Essential (primary) hypertension: Secondary | ICD-10-CM | POA: Diagnosis not present

## 2023-01-16 DIAGNOSIS — N644 Mastodynia: Secondary | ICD-10-CM | POA: Diagnosis not present

## 2023-01-16 DIAGNOSIS — R92333 Mammographic heterogeneous density, bilateral breasts: Secondary | ICD-10-CM | POA: Diagnosis not present

## 2023-02-07 DIAGNOSIS — Z794 Long term (current) use of insulin: Secondary | ICD-10-CM | POA: Diagnosis not present

## 2023-02-07 DIAGNOSIS — E119 Type 2 diabetes mellitus without complications: Secondary | ICD-10-CM | POA: Diagnosis not present

## 2023-02-07 DIAGNOSIS — Z7985 Long-term (current) use of injectable non-insulin antidiabetic drugs: Secondary | ICD-10-CM | POA: Diagnosis not present

## 2023-02-07 DIAGNOSIS — I10 Essential (primary) hypertension: Secondary | ICD-10-CM | POA: Diagnosis not present

## 2023-02-07 DIAGNOSIS — E782 Mixed hyperlipidemia: Secondary | ICD-10-CM | POA: Diagnosis not present

## 2023-03-03 DIAGNOSIS — Z419 Encounter for procedure for purposes other than remedying health state, unspecified: Secondary | ICD-10-CM | POA: Diagnosis not present

## 2023-03-21 DIAGNOSIS — Z3042 Encounter for surveillance of injectable contraceptive: Secondary | ICD-10-CM | POA: Diagnosis not present

## 2023-04-02 DIAGNOSIS — Z419 Encounter for procedure for purposes other than remedying health state, unspecified: Secondary | ICD-10-CM | POA: Diagnosis not present

## 2023-05-03 DIAGNOSIS — Z419 Encounter for procedure for purposes other than remedying health state, unspecified: Secondary | ICD-10-CM | POA: Diagnosis not present

## 2023-05-12 DIAGNOSIS — Z3202 Encounter for pregnancy test, result negative: Secondary | ICD-10-CM | POA: Diagnosis not present

## 2023-05-12 DIAGNOSIS — Z20822 Contact with and (suspected) exposure to covid-19: Secondary | ICD-10-CM | POA: Diagnosis not present

## 2023-05-12 DIAGNOSIS — Z6827 Body mass index (BMI) 27.0-27.9, adult: Secondary | ICD-10-CM | POA: Diagnosis not present

## 2023-05-12 DIAGNOSIS — J069 Acute upper respiratory infection, unspecified: Secondary | ICD-10-CM | POA: Diagnosis not present

## 2023-06-02 DIAGNOSIS — Z419 Encounter for procedure for purposes other than remedying health state, unspecified: Secondary | ICD-10-CM | POA: Diagnosis not present

## 2023-06-10 DIAGNOSIS — Z3042 Encounter for surveillance of injectable contraceptive: Secondary | ICD-10-CM | POA: Diagnosis not present

## 2023-07-03 DIAGNOSIS — Z419 Encounter for procedure for purposes other than remedying health state, unspecified: Secondary | ICD-10-CM | POA: Diagnosis not present

## 2023-07-26 DIAGNOSIS — Z793 Long term (current) use of hormonal contraceptives: Secondary | ICD-10-CM | POA: Diagnosis not present

## 2023-07-26 DIAGNOSIS — Z01419 Encounter for gynecological examination (general) (routine) without abnormal findings: Secondary | ICD-10-CM | POA: Diagnosis not present

## 2023-07-26 DIAGNOSIS — Z0000 Encounter for general adult medical examination without abnormal findings: Secondary | ICD-10-CM | POA: Diagnosis not present

## 2023-07-29 DIAGNOSIS — E782 Mixed hyperlipidemia: Secondary | ICD-10-CM | POA: Diagnosis not present

## 2023-07-29 DIAGNOSIS — Z794 Long term (current) use of insulin: Secondary | ICD-10-CM | POA: Diagnosis not present

## 2023-07-29 DIAGNOSIS — I10 Essential (primary) hypertension: Secondary | ICD-10-CM | POA: Diagnosis not present

## 2023-07-29 DIAGNOSIS — E119 Type 2 diabetes mellitus without complications: Secondary | ICD-10-CM | POA: Diagnosis not present

## 2023-08-03 DIAGNOSIS — Z419 Encounter for procedure for purposes other than remedying health state, unspecified: Secondary | ICD-10-CM | POA: Diagnosis not present

## 2023-08-29 DIAGNOSIS — Z3042 Encounter for surveillance of injectable contraceptive: Secondary | ICD-10-CM | POA: Diagnosis not present

## 2023-08-31 DIAGNOSIS — Z419 Encounter for procedure for purposes other than remedying health state, unspecified: Secondary | ICD-10-CM | POA: Diagnosis not present

## 2023-10-12 DIAGNOSIS — Z419 Encounter for procedure for purposes other than remedying health state, unspecified: Secondary | ICD-10-CM | POA: Diagnosis not present

## 2023-11-11 DIAGNOSIS — Z419 Encounter for procedure for purposes other than remedying health state, unspecified: Secondary | ICD-10-CM | POA: Diagnosis not present

## 2023-11-14 DIAGNOSIS — Z3042 Encounter for surveillance of injectable contraceptive: Secondary | ICD-10-CM | POA: Diagnosis not present

## 2023-11-18 ENCOUNTER — Emergency Department (HOSPITAL_COMMUNITY)

## 2023-11-18 ENCOUNTER — Observation Stay (HOSPITAL_COMMUNITY)
Admission: EM | Admit: 2023-11-18 | Discharge: 2023-11-21 | Disposition: A | Discharge status: Home / Self Care | Attending: Internal Medicine | Admitting: Internal Medicine

## 2023-11-18 ENCOUNTER — Other Ambulatory Visit: Payer: Self-pay

## 2023-11-18 DIAGNOSIS — F10920 Alcohol use, unspecified with intoxication, uncomplicated: Secondary | ICD-10-CM | POA: Insufficient documentation

## 2023-11-18 DIAGNOSIS — R519 Headache, unspecified: Secondary | ICD-10-CM | POA: Diagnosis not present

## 2023-11-18 DIAGNOSIS — R945 Abnormal results of liver function studies: Secondary | ICD-10-CM | POA: Insufficient documentation

## 2023-11-18 DIAGNOSIS — M549 Dorsalgia, unspecified: Secondary | ICD-10-CM

## 2023-11-18 DIAGNOSIS — R0789 Other chest pain: Secondary | ICD-10-CM | POA: Diagnosis not present

## 2023-11-18 DIAGNOSIS — J984 Other disorders of lung: Secondary | ICD-10-CM | POA: Diagnosis not present

## 2023-11-18 DIAGNOSIS — E876 Hypokalemia: Secondary | ICD-10-CM | POA: Insufficient documentation

## 2023-11-18 DIAGNOSIS — A419 Sepsis, unspecified organism: Secondary | ICD-10-CM | POA: Diagnosis not present

## 2023-11-18 DIAGNOSIS — Z91040 Latex allergy status: Secondary | ICD-10-CM | POA: Insufficient documentation

## 2023-11-18 DIAGNOSIS — M545 Low back pain, unspecified: Secondary | ICD-10-CM | POA: Diagnosis not present

## 2023-11-18 DIAGNOSIS — J189 Pneumonia, unspecified organism: Principal | ICD-10-CM | POA: Diagnosis present

## 2023-11-18 DIAGNOSIS — Z794 Long term (current) use of insulin: Secondary | ICD-10-CM | POA: Diagnosis not present

## 2023-11-18 DIAGNOSIS — R071 Chest pain on breathing: Secondary | ICD-10-CM | POA: Diagnosis not present

## 2023-11-18 DIAGNOSIS — E785 Hyperlipidemia, unspecified: Secondary | ICD-10-CM

## 2023-11-18 DIAGNOSIS — R079 Chest pain, unspecified: Secondary | ICD-10-CM

## 2023-11-18 DIAGNOSIS — E119 Type 2 diabetes mellitus without complications: Secondary | ICD-10-CM

## 2023-11-18 DIAGNOSIS — Z743 Need for continuous supervision: Secondary | ICD-10-CM | POA: Diagnosis not present

## 2023-11-18 DIAGNOSIS — I10 Essential (primary) hypertension: Secondary | ICD-10-CM

## 2023-11-18 DIAGNOSIS — F17210 Nicotine dependence, cigarettes, uncomplicated: Secondary | ICD-10-CM | POA: Diagnosis not present

## 2023-11-18 DIAGNOSIS — R918 Other nonspecific abnormal finding of lung field: Secondary | ICD-10-CM | POA: Diagnosis not present

## 2023-11-18 LAB — BASIC METABOLIC PANEL WITH GFR
Anion gap: 10 (ref 5–15)
BUN: 10 mg/dL (ref 6–20)
CO2: 23 mmol/L (ref 22–32)
Calcium: 9 mg/dL (ref 8.9–10.3)
Chloride: 103 mmol/L (ref 98–111)
Creatinine, Ser: 0.94 mg/dL (ref 0.44–1.00)
GFR, Estimated: >60 mL/min (ref >60)
Glucose, Bld: 173 mg/dL — ABNORMAL HIGH (ref 70–99)
Potassium: 3.2 mmol/L — ABNORMAL LOW (ref 3.5–5.1)
Sodium: 136 mmol/L (ref 135–145)

## 2023-11-18 LAB — CBC
HCT: 35.3 % — ABNORMAL LOW (ref 36.0–46.0)
Hemoglobin: 11.6 g/dL — ABNORMAL LOW (ref 12.0–15.0)
MCH: 30.1 pg (ref 26.0–34.0)
MCHC: 32.9 g/dL (ref 30.0–36.0)
MCV: 91.7 fL (ref 80.0–100.0)
Platelets: 278 10*3/uL (ref 150–400)
RBC: 3.85 MIL/uL — ABNORMAL LOW (ref 3.87–5.11)
RDW: 13 % (ref 11.5–15.5)
WBC: 12.5 10*3/uL — ABNORMAL HIGH (ref 4.0–10.5)
nRBC: 0 % (ref 0.0–0.2)

## 2023-11-18 LAB — CK: Total CK: 196 U/L (ref 38–234)

## 2023-11-18 LAB — HEMOGLOBIN A1C
Hgb A1c MFr Bld: 7.1 % — ABNORMAL HIGH (ref 4.8–5.6)
Mean Plasma Glucose: 157.07 mg/dL

## 2023-11-18 LAB — RESP PANEL BY RT-PCR (RSV, FLU A&B, COVID)  RVPGX2
Influenza A by PCR: NEGATIVE
Influenza B by PCR: NEGATIVE
Resp Syncytial Virus by PCR: NEGATIVE
SARS Coronavirus 2 by RT PCR: NEGATIVE

## 2023-11-18 LAB — D-DIMER, QUANTITATIVE: D-Dimer, Quant: 0.87 ug{FEU}/mL — ABNORMAL HIGH (ref 0.00–0.50)

## 2023-11-18 LAB — GLUCOSE, CAPILLARY
Glucose-Capillary: 159 mg/dL — ABNORMAL HIGH (ref 70–99)
Glucose-Capillary: 178 mg/dL — ABNORMAL HIGH (ref 70–99)

## 2023-11-18 LAB — HCG, SERUM, QUALITATIVE: Preg, Serum: NEGATIVE

## 2023-11-18 LAB — TROPONIN I (HIGH SENSITIVITY)
Troponin I (High Sensitivity): 8 ng/L (ref <18)
Troponin I (High Sensitivity): 8 ng/L (ref <18)

## 2023-11-18 LAB — CBG MONITORING, ED: Glucose-Capillary: 163 mg/dL — ABNORMAL HIGH (ref 70–99)

## 2023-11-18 MED ORDER — INSULIN ASPART 100 UNIT/ML IJ SOLN
0.0000 [IU] | Freq: Every day | INTRAMUSCULAR | Status: DC
Start: 2023-11-18 — End: 2023-11-21

## 2023-11-18 MED ORDER — SODIUM CHLORIDE 0.9 % IV SOLN
500.0000 mg | Freq: Once | INTRAVENOUS | Status: AC
  Administered 2023-11-18: 500 mg via INTRAVENOUS
  Filled 2023-11-18: qty 5

## 2023-11-18 MED ORDER — POTASSIUM CHLORIDE CRYS ER 20 MEQ PO TBCR
40.0000 meq | EXTENDED_RELEASE_TABLET | Freq: Once | ORAL | Status: AC
  Administered 2023-11-18: 40 meq via ORAL
  Filled 2023-11-18: qty 2

## 2023-11-18 MED ORDER — ROSUVASTATIN CALCIUM 20 MG PO TABS
20.0000 mg | ORAL_TABLET | Freq: Every day | ORAL | Status: DC
  Administered 2023-11-19 – 2023-11-21 (×3): 20 mg via ORAL
  Filled 2023-11-18 (×3): qty 1

## 2023-11-18 MED ORDER — LACTATED RINGERS IV BOLUS
500.0000 mL | Freq: Once | INTRAVENOUS | Status: AC
  Administered 2023-11-18: 500 mL via INTRAVENOUS

## 2023-11-18 MED ORDER — OXYCODONE HCL ER 10 MG PO T12A: 10.0000 mg | EXTENDED_RELEASE_TABLET | Freq: Once | ORAL | Status: DC

## 2023-11-18 MED ORDER — CELECOXIB 200 MG PO CAPS
200.0000 mg | ORAL_CAPSULE | ORAL | Status: AC
  Administered 2023-11-18: 200 mg via ORAL
  Filled 2023-11-18: qty 1

## 2023-11-18 MED ORDER — INSULIN GLARGINE-YFGN 100 UNIT/ML ~~LOC~~ SOLN
15.0000 [IU] | Freq: Every day | SUBCUTANEOUS | Status: DC
  Administered 2023-11-18: 15 [IU] via SUBCUTANEOUS
  Filled 2023-11-18 (×4): qty 0.15

## 2023-11-18 MED ORDER — SODIUM CHLORIDE 0.9 % IV SOLN
1.0000 g | Freq: Once | INTRAVENOUS | Status: AC
  Administered 2023-11-18: 1 g via INTRAVENOUS
  Filled 2023-11-18: qty 10

## 2023-11-18 MED ORDER — LABETALOL HCL 5 MG/ML IV SOLN
20.0000 mg | INTRAVENOUS | Status: DC | PRN
  Administered 2023-11-19 – 2023-11-21 (×2): 20 mg via INTRAVENOUS
  Filled 2023-11-18 (×2): qty 4

## 2023-11-18 MED ORDER — INSULIN ASPART 100 UNIT/ML IJ SOLN
0.0000 [IU] | Freq: Three times a day (TID) | INTRAMUSCULAR | Status: DC
  Administered 2023-11-19: 8 [IU] via SUBCUTANEOUS
  Administered 2023-11-19 (×2): 3 [IU] via SUBCUTANEOUS
  Administered 2023-11-20 (×2): 2 [IU] via SUBCUTANEOUS
  Administered 2023-11-20: 5 [IU] via SUBCUTANEOUS

## 2023-11-18 MED ORDER — FENTANYL CITRATE PF 50 MCG/ML IJ SOSY
50.0000 ug | PREFILLED_SYRINGE | Freq: Once | INTRAMUSCULAR | Status: AC
  Administered 2023-11-18: 50 ug via INTRAVENOUS
  Filled 2023-11-18: qty 1

## 2023-11-18 MED ORDER — POTASSIUM CHLORIDE 20 MEQ PO PACK: 40.0000 meq | PACK | Freq: Once | ORAL | Status: DC

## 2023-11-18 MED ORDER — CELECOXIB 200 MG PO CAPS
200.0000 mg | ORAL_CAPSULE | Freq: Two times a day (BID) | ORAL | Status: AC
  Administered 2023-11-19 (×2): 200 mg via ORAL
  Filled 2023-11-18 (×2): qty 1

## 2023-11-18 MED ORDER — ACETAMINOPHEN 10 MG/ML IV SOLN
1000.0000 mg | Freq: Once | INTRAVENOUS | Status: AC
  Administered 2023-11-18: 1000 mg via INTRAVENOUS
  Filled 2023-11-18: qty 100

## 2023-11-18 MED ORDER — ACETAMINOPHEN 325 MG PO TABS: 650.0000 mg | ORAL_TABLET | ORAL | Status: DC

## 2023-11-18 MED ORDER — AZITHROMYCIN 500 MG IV SOLR
500.0000 mg | INTRAVENOUS | Status: DC
  Administered 2023-11-19 – 2023-11-20 (×2): 500 mg via INTRAVENOUS
  Filled 2023-11-18 (×4): qty 5

## 2023-11-18 MED ORDER — INSULIN GLARGINE 100 UNITS/ML SOLOSTAR PEN: 30.0000 [IU] | PEN_INJECTOR | Freq: Every day | SUBCUTANEOUS | Status: DC

## 2023-11-18 MED ORDER — HYDRALAZINE HCL 20 MG/ML IJ SOLN
10.0000 mg | INTRAMUSCULAR | Status: DC | PRN
  Administered 2023-11-18 – 2023-11-20 (×2): 10 mg via INTRAVENOUS
  Filled 2023-11-18 (×2): qty 1

## 2023-11-18 MED ORDER — IOHEXOL 350 MG/ML SOLN
75.0000 mL | Freq: Once | INTRAVENOUS | Status: AC | PRN
  Administered 2023-11-18: 75 mL via INTRAVENOUS

## 2023-11-18 MED ORDER — ACETAMINOPHEN 650 MG RE SUPP: 650.0000 mg | Freq: Four times a day (QID) | RECTAL | Status: DC | PRN

## 2023-11-18 MED ORDER — POLYETHYLENE GLYCOL 3350 17 G PO PACK: 17.0000 g | PACK | Freq: Every day | ORAL | Status: DC | PRN

## 2023-11-18 MED ORDER — INSULIN GLARGINE 100 UNITS/ML SOLOSTAR PEN
15.0000 [IU] | PEN_INJECTOR | Freq: Every day | SUBCUTANEOUS | Status: DC
  Filled 2023-11-18: qty 3

## 2023-11-18 MED ORDER — LACTATED RINGERS IV BOLUS
1000.0000 mL | Freq: Once | INTRAVENOUS | Status: AC
Start: 2023-11-18 — End: 2023-11-18
  Administered 2023-11-18: 1000 mL via INTRAVENOUS

## 2023-11-18 MED ORDER — SODIUM CHLORIDE 0.9% FLUSH
3.0000 mL | Freq: Two times a day (BID) | INTRAVENOUS | Status: DC
  Administered 2023-11-19 – 2023-11-21 (×4): 3 mL via INTRAVENOUS

## 2023-11-18 MED ORDER — ACETAMINOPHEN 325 MG PO TABS: 650.0000 mg | ORAL_TABLET | Freq: Four times a day (QID) | ORAL | Status: DC | PRN

## 2023-11-18 MED ORDER — ENOXAPARIN SODIUM 40 MG/0.4ML IJ SOSY
40.0000 mg | PREFILLED_SYRINGE | INTRAMUSCULAR | Status: DC
  Administered 2023-11-19 – 2023-11-21 (×3): 40 mg via SUBCUTANEOUS
  Filled 2023-11-18 (×3): qty 0.4

## 2023-11-18 MED ORDER — SODIUM CHLORIDE 0.9 % IV SOLN
1.0000 g | INTRAVENOUS | Status: DC
  Administered 2023-11-19: 1 g via INTRAVENOUS
  Filled 2023-11-18: qty 10

## 2023-11-18 MED ORDER — SODIUM CHLORIDE 0.9% FLUSH
3.0000 mL | Freq: Two times a day (BID) | INTRAVENOUS | Status: DC
  Administered 2023-11-18 – 2023-11-21 (×5): 3 mL via INTRAVENOUS

## 2023-11-18 MED ORDER — SODIUM CHLORIDE 0.9 % IV SOLN: INTRAVENOUS | Status: DC

## 2023-11-18 NOTE — Assessment & Plan Note (Addendum)
 Present on admission manifesting as temperature 103 and tachycardia.  Focus is right lower lobe pneumonia. Check blood cultures. Check RT PCR. C.w. ceftriaxone  and azithromycin . No metaboilc acidosis. S/p 1.5 liter NS bolus. LR 100/hr tonight.

## 2023-11-18 NOTE — Assessment & Plan Note (Signed)
 Pending HCG. Seems to be part of total body aches. Monitor X 24 hours as above.

## 2023-11-18 NOTE — Assessment & Plan Note (Signed)
 Ported to me by patient.  Will start the patient on Lantus  15 units at bedtime, insulin  sliding scale.

## 2023-11-18 NOTE — ED Triage Notes (Signed)
 Pt bib [facility] from home for Rsided chest pain that started this morning. Pt says pain is sharp and constant, non radiating. EMS administered asprin and since then pain is only intermitent when she takes deep breathsPt also reports HA.    174/82 CBG 178 99.6 TEMP  105 HR  20g LAC      HX : LUPUS, DIABETES, HTN

## 2023-11-18 NOTE — Assessment & Plan Note (Signed)
 This is chronic, continue with Crestor 

## 2023-11-18 NOTE — Assessment & Plan Note (Signed)
 Likely due to above. However if SBP rises over 160 mmHg, will treat with iv agents as needed.
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
## 2023-11-18 NOTE — ED Notes (Signed)
 Floor notified that patient is on the way upstairs.

## 2023-11-18 NOTE — Progress Notes (Signed)
 Transfer report called to [facility].

## 2023-11-18 NOTE — Progress Notes (Signed)
 Pt transferred to [facility]. Cornelius Dill RN received the pt. Pt alert and oriented x4. Pt's mother with her and being supportive.

## 2023-11-18 NOTE — Assessment & Plan Note (Signed)
 This is pleuritic in nature. Causing splinting. Cont pulse ox. Will treat with celebrex  + acetaminopen + 1 dose of oxycontin  and incentive spirometry.

## 2023-11-18 NOTE — Progress Notes (Signed)
 Patient received from the ED. This RN didn't aware about patient arrival. ED had been informed to the charge nurse upon transfer. Once patient got here, this RN noticed that patient is being RED mews before transfer to this unit.  BP 173/102 mmhg, MAP 110, HR 120/min, Temperature 103.1 F.  RN made Charge nurse aware.  Made the provider aware. RN started with STAT bolus LR to be given by the ED.   Provider  made the transfer order.  Waiting  to  transfer patient.

## 2023-11-18 NOTE — ED Notes (Signed)
 CCMD notified. Pt is on monitor.

## 2023-11-19 DIAGNOSIS — J189 Pneumonia, unspecified organism: Secondary | ICD-10-CM

## 2023-11-19 LAB — COMPREHENSIVE METABOLIC PANEL WITH GFR
ALT: 30 U/L (ref 0–44)
AST: 26 U/L (ref 15–41)
Albumin: 3.6 g/dL (ref 3.5–5.0)
Alkaline Phosphatase: 46 U/L (ref 38–126)
Anion gap: 13 (ref 5–15)
BUN: 8 mg/dL (ref 6–20)
CO2: 20 mmol/L — ABNORMAL LOW (ref 22–32)
Calcium: 9.2 mg/dL (ref 8.9–10.3)
Chloride: 102 mmol/L (ref 98–111)
Creatinine, Ser: 0.75 mg/dL (ref 0.44–1.00)
GFR, Estimated: >60 mL/min (ref >60)
Glucose, Bld: 145 mg/dL — ABNORMAL HIGH (ref 70–99)
Potassium: 3.4 mmol/L — ABNORMAL LOW (ref 3.5–5.1)
Sodium: 135 mmol/L (ref 135–145)
Total Bilirubin: 0.5 mg/dL (ref 0.0–1.2)
Total Protein: 7.7 g/dL (ref 6.5–8.1)

## 2023-11-19 LAB — PROTIME-INR
INR: 1.2 (ref 0.8–1.2)
Prothrombin Time: 15.7 s — ABNORMAL HIGH (ref 11.4–15.2)

## 2023-11-19 LAB — GLUCOSE, CAPILLARY
Glucose-Capillary: 260 mg/dL — ABNORMAL HIGH (ref 70–99)
Glucose-Capillary: 165 mg/dL — ABNORMAL HIGH (ref 70–99)
Glucose-Capillary: 181 mg/dL — ABNORMAL HIGH (ref 70–99)
Glucose-Capillary: 196 mg/dL — ABNORMAL HIGH (ref 70–99)

## 2023-11-19 LAB — LACTIC ACID, PLASMA
Lactic Acid, Venous: 1.7 mmol/L (ref 0.5–1.9)
Lactic Acid, Venous: 1.7 mmol/L (ref 0.5–1.9)

## 2023-11-19 LAB — CBC
HCT: 33.8 % — ABNORMAL LOW (ref 36.0–46.0)
Hemoglobin: 11.3 g/dL — ABNORMAL LOW (ref 12.0–15.0)
MCH: 29.6 pg (ref 26.0–34.0)
MCHC: 33.4 g/dL (ref 30.0–36.0)
MCV: 88.5 fL (ref 80.0–100.0)
Platelets: 279 10*3/uL (ref 150–400)
RBC: 3.82 MIL/uL — ABNORMAL LOW (ref 3.87–5.11)
RDW: 12.8 % (ref 11.5–15.5)
WBC: 9.1 10*3/uL (ref 4.0–10.5)
nRBC: 0 % (ref 0.0–0.2)

## 2023-11-19 LAB — HCG, QUANTITATIVE, PREGNANCY: hCG, Beta Chain, Quant, S: 1 m[IU]/mL (ref <5)

## 2023-11-19 LAB — APTT: aPTT: 38 s — ABNORMAL HIGH (ref 24–36)

## 2023-11-19 LAB — HCG, SERUM, QUALITATIVE: Preg, Serum: NEGATIVE

## 2023-11-19 MED ORDER — LABETALOL HCL 300 MG PO TABS
300.0000 mg | ORAL_TABLET | Freq: Every day | ORAL | Status: DC
  Administered 2023-11-19 – 2023-11-20 (×2): 300 mg via ORAL
  Filled 2023-11-19 (×2): qty 1

## 2023-11-19 MED ORDER — ACETAMINOPHEN 325 MG PO TABS
650.0000 mg | ORAL_TABLET | Freq: Four times a day (QID) | ORAL | Status: AC | PRN
  Administered 2023-11-19 – 2023-11-20 (×2): 650 mg via ORAL
  Filled 2023-11-19 (×2): qty 2

## 2023-11-19 MED ORDER — SODIUM CHLORIDE 0.9% FLUSH: 10.0000 mL | INTRAVENOUS | Status: DC | PRN

## 2023-11-19 MED ORDER — SODIUM CHLORIDE 0.9% FLUSH
10.0000 mL | Freq: Two times a day (BID) | INTRAVENOUS | Status: DC
  Administered 2023-11-19 – 2023-11-21 (×3): 10 mL

## 2023-11-19 MED ORDER — POTASSIUM CHLORIDE CRYS ER 20 MEQ PO TBCR
40.0000 meq | EXTENDED_RELEASE_TABLET | Freq: Once | ORAL | Status: AC
  Administered 2023-11-19: 40 meq via ORAL
  Filled 2023-11-19: qty 2

## 2023-11-19 MED ORDER — ACETAMINOPHEN 325 MG PO TABS
650.0000 mg | ORAL_TABLET | Freq: Once | ORAL | Status: AC | PRN
  Administered 2023-11-19: 650 mg via ORAL
  Filled 2023-11-19: qty 2

## 2023-11-19 MED ORDER — INSULIN GLARGINE-YFGN 100 UNIT/ML ~~LOC~~ SOLN
20.0000 [IU] | Freq: Every day | SUBCUTANEOUS | Status: DC
  Administered 2023-11-19 – 2023-11-20 (×2): 20 [IU] via SUBCUTANEOUS
  Filled 2023-11-19 (×3): qty 0.2

## 2023-11-19 MED ORDER — GUAIFENESIN-DM 100-10 MG/5ML PO SYRP
5.0000 mL | ORAL_SOLUTION | ORAL | Status: DC | PRN
Start: 2023-11-19 — End: 2023-11-21
  Administered 2023-11-19 – 2023-11-21 (×4): 5 mL via ORAL
  Filled 2023-11-19 (×4): qty 10

## 2023-11-19 NOTE — Progress Notes (Signed)
 PROGRESS NOTE    Taylor Hood  KGM:[id number] DOB: [birth year] DOA: 11/18/2023 PCP: Jeanene Milder, FNP  Brief Narrative: This 38 year old female with history of diabetes lupus and hyperlipidemia admitted with pleuritic chest pain cough and fever. She was tachycardic and febrile in the ER with a temp of 103 CT PE protocol showed no evidence of pulmonary embolism, patchy airspace disease in the right lower lobe compatible with pneumonia.  Prominent right hilar and subcarinal lymph nodes. She was not hypoxic in the ER. hCG was pending on admission I still do not see hCG back I will order hCG serum and urine. Assessment & Plan:   Principal Problem:   Sepsis (HCC) Active Problems:   Pneumonia   Diabetes mellitus (HCC)   Dyslipidemia   Chest pain   Back pain   HTN (hypertension)  #1 sepsis present on admission patient admitted with pleuritic chest pain cough and fever, she was found to have right lower lobe pneumonia.  She was not hypoxic in the ED.  She met criteria for sepsis with fever of 103 tachycardia and leukocytosis.  Labs from today are pending.  Lactic acid ordered pending.  Continue Rocephin  and azithromycin .  COVID flu and RSV negative. IS and nebs   #2 hypertension restart labetalol   #3 type 2 diabetes increasing Semglee  to 20 units nightly Last A1c 7.1  5/19  #4 hyperlipidemia continue home Crestor    Estimated body mass index is 29.54 kg/m as calculated from the following:   Height as of this encounter: 5\' 6"  (1.676 m).   Weight as of this encounter: 83 kg.  DVT prophylaxis: lovenox  Code Status:full Family Communication: none Disposition Plan:  Status is: Inpatient Remains inpatient appropriate because: acute illness   Consultants: none  Procedures: none Antimicrobials: rocephin  azithro  Subjective: Still has severe pleuritic chest pain On RA C/O COUGH SOB   Objective: Vitals:   11/18/23 2053 11/18/23 2113 11/19/23 0459 11/19/23 0754  BP: 139/89  132/84 (!) 149/97 (!) 143/99  Pulse: (!) 105 100 91 93  Resp: 18 (!) 24 16 16   Temp: 99.4 F (37.4 C) 99.4 F (37.4 C) 99.8 F (37.7 C)   TempSrc: Oral Oral    SpO2: 97% 98% 94% 96%  Weight:      Height:        Intake/Output Summary (Last 24 hours) at 11/19/2023 1353 Last data filed at 11/19/2023 0900 Gross per 24 hour  Intake 1363.95 ml  Output --  Net 1363.95 ml   Filed Weights   11/18/23 1018  Weight: 83 kg    Examination:  General exam: Appears in nad Respiratory system: rhonchi right Cardiovascular system:reg Gastrointestinal system: Abdomen is nondistended, soft and nontender. No organomegaly or masses felt. Normal bowel sounds heard. Central nervous system: Alert and oriented. No focal neurological deficits. Extremities: no edema   Data Reviewed: I have personally reviewed following labs and imaging studies  CBC: Recent Labs  Lab 11/18/23 1027  WBC 12.5*  HGB 11.6*  HCT 35.3*  MCV 91.7  PLT 278   Basic Metabolic Panel: Recent Labs  Lab 11/18/23 1027  NA 136  K 3.2*  CL 103  CO2 23  GLUCOSE 173*  BUN 10  CREATININE 0.94  CALCIUM  9.0   GFR: Estimated Creatinine Clearance: 89 mL/min (by C-G formula based on SCr of 0.94 mg/dL). Liver Function Tests: No results for input(s): "AST", "ALT", "ALKPHOS", "BILITOT", "PROT", "ALBUMIN" in the last 168 hours. No results for input(s): "LIPASE", "AMYLASE" in the last  168 hours. No results for input(s): "AMMONIA" in the last 168 hours. Coagulation Profile: No results for input(s): "INR", "PROTIME" in the last 168 hours. Cardiac Enzymes: Recent Labs  Lab 11/18/23 1755  CKTOTAL 196   BNP (last 3 results) No results for input(s): "PROBNP" in the last 8760 hours. HbA1C: Recent Labs    11/18/23 1847  HGBA1C 7.1*   CBG: Recent Labs  Lab 11/18/23 1024 11/18/23 2056 11/18/23 2150 11/19/23 0859 11/19/23 1303  GLUCAP 163* 159* 178* 260* 165*   Lipid Profile: No results for input(s): "CHOL",  "HDL", "LDLCALC", "TRIG", "CHOLHDL", "LDLDIRECT" in the last 72 hours. Thyroid  Function Tests: No results for input(s): "TSH", "T4TOTAL", "FREET4", "T3FREE", "THYROIDAB" in the last 72 hours. Anemia Panel: No results for input(s): "VITAMINB12", "FOLATE", "FERRITIN", "TIBC", "IRON", "RETICCTPCT" in the last 72 hours. Sepsis Labs: No results for input(s): "PROCALCITON", "LATICACIDVEN" in the last 168 hours.  Recent Results (from the past 240 hours)  Resp panel by RT-PCR (RSV, Flu A&B, Covid) Anterior Nasal Swab     Status: None   Collection Time: 11/18/23  6:59 PM   Specimen: Anterior Nasal Swab  Result Value Ref Range Status   SARS Coronavirus 2 by RT PCR NEGATIVE NEGATIVE Final   Influenza A by PCR NEGATIVE NEGATIVE Final   Influenza B by PCR NEGATIVE NEGATIVE Final    Comment: (NOTE) The Xpert Xpress SARS-CoV-2/FLU/RSV plus assay is intended as an aid in the diagnosis of influenza from Nasopharyngeal swab specimens and should not be used as a sole basis for treatment. Nasal washings and aspirates are unacceptable for Xpert Xpress SARS-CoV-2/FLU/RSV testing.  Fact Sheet for Patients: BloggerCourse.com  Fact Sheet for Healthcare Providers: SeriousBroker.it  This test is not yet approved or cleared by the [place]  FDA and has been authorized for detection and/or diagnosis of SARS-CoV-2 by FDA under an Emergency Use Authorization (EUA). This EUA will remain in effect (meaning this test can be used) for the duration of the COVID-19 declaration under Section 564(b)(1) of the Act, 21 U.S.C. section 360bbb-3(b)(1), unless the authorization is terminated or revoked.     Resp Syncytial Virus by PCR NEGATIVE NEGATIVE Final    Comment: (NOTE) Fact Sheet for Patients: BloggerCourse.com  Fact Sheet for Healthcare Providers: SeriousBroker.it  This test is not yet approved or cleared  by the [place]  FDA and has been authorized for detection and/or diagnosis of SARS-CoV-2 by FDA under an Emergency Use Authorization (EUA). This EUA will remain in effect (meaning this test can be used) for the duration of the COVID-19 declaration under Section 564(b)(1) of the Act, 21 U.S.C. section 360bbb-3(b)(1), unless the authorization is terminated or revoked.  Performed at [facility], [address] [place], [name] [address]   Culture, blood (Routine X 2) w Reflex to ID Panel     Status: None (Preliminary result)   Collection Time: 11/18/23  7:49 PM   Specimen: BLOOD  Result Value Ref Range Status   Specimen Description BLOOD SITE NOT SPECIFIED  Final   Special Requests   Final    BOTTLES DRAWN AEROBIC AND ANAEROBIC Blood Culture results may not be optimal due to an inadequate volume of blood received in culture bottles   Culture   Final    NO GROWTH < 12 HOURS Performed at [facility], [address], [place], [name] [address]    Report Status PENDING  Incomplete  Culture, blood (Routine X 2) w Reflex to ID Panel  Status: None (Preliminary result)   Collection Time: 11/18/23  7:50 PM   Specimen: BLOOD  Result Value Ref Range Status   Specimen Description BLOOD SITE NOT SPECIFIED  Final   Special Requests   Final    BOTTLES DRAWN AEROBIC AND ANAEROBIC Blood Culture results may not be optimal due to an inadequate volume of blood received in culture bottles   Culture   Final    NO GROWTH < 12 HOURS Performed at [facility], [address] [place], [name] [address]    Report Status PENDING  Incomplete         Radiology Studies: CT Angio Chest PE W and/or Wo Contrast Result Date: 11/18/2023 CLINICAL DATA:  High probability for PE EXAM: CT ANGIOGRAPHY CHEST WITH CONTRAST TECHNIQUE: Multidetector CT imaging of the chest was performed using the standard protocol during bolus administration of intravenous contrast. Multiplanar CT image  reconstructions and MIPs were obtained to evaluate the vascular anatomy. RADIATION DOSE REDUCTION: This exam was performed according to the departmental dose-optimization program which includes automated exposure control, adjustment of the mA and/or kV according to patient size and/or use of iterative reconstruction technique. CONTRAST:  75mL OMNIPAQUE  IOHEXOL  350 MG/ML SOLN COMPARISON:  CT PE protocol 05/15/2007. FINDINGS: Cardiovascular: Satisfactory opacification of the pulmonary arteries to the segmental level. No evidence of pulmonary embolism. Normal heart size. No pericardial effusion. Mediastinum/Nodes: There are prominent right hilar and subcarinal lymph nodes. Visualized esophagus and thyroid  gland are within normal limits. The Lungs/Pleura: There is patchy airspace disease in the right lower lobe. There is no pleural effusion or pneumothorax. The lungs are otherwise clear. Upper Abdomen: No acute abnormality. Musculoskeletal: No chest wall abnormality. No acute or significant osseous findings. Review of the MIP images confirms the above findings. IMPRESSION: 1. No evidence for pulmonary embolism. 2. Patchy airspace disease in the right lower lobe compatible with pneumonia. 3. Prominent right hilar and subcarinal lymph nodes, likely reactive. Electronically Signed   By: Tyron Gallon M.D.   On: 11/18/2023 16:07   CT Head Wo Contrast Result Date: 11/18/2023 CLINICAL DATA:  Headache EXAM: CT HEAD WITHOUT CONTRAST TECHNIQUE: Contiguous axial images were obtained from the base of the skull through the vertex without intravenous contrast. RADIATION DOSE REDUCTION: This exam was performed according to the departmental dose-optimization program which includes automated exposure control, adjustment of the mA and/or kV according to patient size and/or use of iterative reconstruction technique. COMPARISON:  CT brain 10/13/2021 FINDINGS: Brain: No acute territorial infarction, hemorrhage or intracranial mass. The  ventricles are nonenlarged. Vascular: No hyperdense vessel or unexpected calcification. Skull: Normal. Negative for fracture or focal lesion. Sinuses/Orbits: Mucous retention cyst in the left maxillary sinus Other: None IMPRESSION: Negative non contrasted CT appearance of the brain. Electronically Signed   By: Esmeralda Hedge M.D.   On: 11/18/2023 16:06   DG Chest 2 View Result Date: 11/18/2023 CLINICAL DATA:  Chest pain. EXAM: CHEST - 2 VIEW COMPARISON:  10/13/2021 FINDINGS: Normal-sized heart. Interval patchy density in the right lower lobe. Clear left lung. Unremarkable bones. IMPRESSION: Right lower lobe pneumonia. Pulmonary infarction is less likely but not excluded. Electronically Signed   By: Catherin Closs M.D.   On: 11/18/2023 12:40    Scheduled Meds:  celecoxib   200 mg Oral BID   enoxaparin  (LOVENOX ) injection  40 mg Subcutaneous Q24H   insulin  aspart  0-15 Units Subcutaneous TID WC   insulin  aspart  0-5 Units Subcutaneous QHS   insulin  glargine-yfgn  15  Units Subcutaneous QHS   oxyCODONE   10 mg Oral Once   rosuvastatin   20 mg Oral Daily   sodium chloride  flush  3 mL Intravenous Q12H   sodium chloride  flush  3 mL Intravenous Q12H   Continuous Infusions:  sodium chloride  100 mL/hr at 11/19/23 0911   azithromycin  (ZITHROMAX ) 500 mg in sodium chloride  0.9 % 250 mL IVPB 500 mg (11/19/23 1235)   cefTRIAXone  (ROCEPHIN )  IV       LOS: 1 day    Time spent: 39 min Barbee Lew, MD  11/19/2023, 1:53 PM

## 2023-11-19 NOTE — Inpatient Diabetes Management (Signed)
 Inpatient Diabetes Program Recommendations  AACE/ADA: New Consensus Statement on Inpatient Glycemic Control (2015)  Target Ranges:  Prepandial:   less than 140 mg/dL      Peak postprandial:   less than 180 mg/dL (1-2 hours)      Critically ill patients:  140 - 180 mg/dL   Lab Results  Component Value Date   GLUCAP 260 (H) 11/19/2023   HGBA1C 7.1 (H) 11/18/2023    Review of Glycemic Control  Latest Reference Range & Units 11/18/23 10:24 11/18/23 20:56 11/18/23 21:50 11/19/23 08:59  Glucose-Capillary 70 - 99 mg/dL 409 (H) 811 (H) 914 (H) 260 (H)   Diabetes history: DM 2 Outpatient Diabetes medications: Novolog  8 units tid, Ozempic 0.5 mg weekly, Lantus  15 Current orders for Inpatient glycemic control:  Semglee  15 units qhs Novolog  0-15 units tid + hs  A1c 7.1% on 5/19  Inpatient Diabetes Program Recommendations:    -   Increase Semglee  to 20 units  Thanks,  Eloise Hake RN, MSN, BC-ADM Inpatient Diabetes Coordinator Team Pager [phone] (8a-5p)

## 2023-11-19 NOTE — Progress Notes (Signed)
 Community-acquired pneumonia/sepsis    11/19/23 1319  TOC Brief Assessment  Insurance and Status Reviewed  Patient has primary care physician Yes  Home environment has been reviewed From home with children  Prior level of function: PTA independent with ADL's, no dme USAGE  Prior/Current Home Services No current home services  Social Drivers of Health Review SDOH reviewed no interventions necessary  Readmission risk has been reviewed No  Transition of care needs transition of care needs identified, TOC will continue to follow   Pt states without transportation issues or RX med concerns. TOC team following and will assist with needs...  Carlee Charters RN,BSN,CM

## 2023-11-19 NOTE — Plan of Care (Signed)
   Problem: Health Behavior/Discharge Planning: Goal: Ability to manage health-related needs will improve Outcome: Progressing

## 2023-11-19 NOTE — Plan of Care (Signed)

## 2023-11-20 ENCOUNTER — Encounter (HOSPITAL_COMMUNITY): Payer: Self-pay | Admitting: Internal Medicine

## 2023-11-20 DIAGNOSIS — Z794 Long term (current) use of insulin: Secondary | ICD-10-CM | POA: Diagnosis not present

## 2023-11-20 DIAGNOSIS — I10 Essential (primary) hypertension: Secondary | ICD-10-CM | POA: Diagnosis not present

## 2023-11-20 DIAGNOSIS — E119 Type 2 diabetes mellitus without complications: Secondary | ICD-10-CM | POA: Diagnosis not present

## 2023-11-20 DIAGNOSIS — J189 Pneumonia, unspecified organism: Secondary | ICD-10-CM | POA: Diagnosis not present

## 2023-11-20 DIAGNOSIS — A419 Sepsis, unspecified organism: Secondary | ICD-10-CM | POA: Diagnosis not present

## 2023-11-20 LAB — CBC
HCT: 30.7 % — ABNORMAL LOW (ref 36.0–46.0)
Hemoglobin: 10.1 g/dL — ABNORMAL LOW (ref 12.0–15.0)
MCH: 29.4 pg (ref 26.0–34.0)
MCHC: 32.9 g/dL (ref 30.0–36.0)
MCV: 89.2 fL (ref 80.0–100.0)
Platelets: 271 10*3/uL (ref 150–400)
RBC: 3.44 MIL/uL — ABNORMAL LOW (ref 3.87–5.11)
RDW: 12.8 % (ref 11.5–15.5)
WBC: 6.9 10*3/uL (ref 4.0–10.5)
nRBC: 0 % (ref 0.0–0.2)

## 2023-11-20 LAB — COMPREHENSIVE METABOLIC PANEL WITH GFR
ALT: 95 U/L — ABNORMAL HIGH (ref 0–44)
AST: 62 U/L — ABNORMAL HIGH (ref 15–41)
Albumin: 3.1 g/dL — ABNORMAL LOW (ref 3.5–5.0)
Alkaline Phosphatase: 44 U/L (ref 38–126)
Anion gap: 7 (ref 5–15)
BUN: 7 mg/dL (ref 6–20)
CO2: 21 mmol/L — ABNORMAL LOW (ref 22–32)
Calcium: 8.1 mg/dL — ABNORMAL LOW (ref 8.9–10.3)
Chloride: 109 mmol/L (ref 98–111)
Creatinine, Ser: 0.66 mg/dL (ref 0.44–1.00)
GFR, Estimated: >60 mL/min (ref >60)
Glucose, Bld: 143 mg/dL — ABNORMAL HIGH (ref 70–99)
Potassium: 3.4 mmol/L — ABNORMAL LOW (ref 3.5–5.1)
Sodium: 137 mmol/L (ref 135–145)
Total Bilirubin: <0.2 mg/dL (ref 0.0–1.2)
Total Protein: 6.9 g/dL (ref 6.5–8.1)

## 2023-11-20 LAB — GLUCOSE, CAPILLARY
Glucose-Capillary: 146 mg/dL — ABNORMAL HIGH (ref 70–99)
Glucose-Capillary: 211 mg/dL — ABNORMAL HIGH (ref 70–99)
Glucose-Capillary: 137 mg/dL — ABNORMAL HIGH (ref 70–99)
Glucose-Capillary: 121 mg/dL — ABNORMAL HIGH (ref 70–99)

## 2023-11-20 LAB — MISC LABCORP TEST (SEND OUT): Labcorp test code: 83935

## 2023-11-20 MED ORDER — LABETALOL HCL 200 MG PO TABS
200.0000 mg | ORAL_TABLET | Freq: Two times a day (BID) | ORAL | Status: DC
Start: 2023-11-20 — End: 2023-11-21
  Administered 2023-11-20 – 2023-11-21 (×2): 200 mg via ORAL
  Filled 2023-11-20 (×3): qty 1

## 2023-11-20 MED ORDER — LEVOFLOXACIN 500 MG PO TABS: 500.0000 mg | ORAL_TABLET | Freq: Every day | ORAL | Status: DC

## 2023-11-20 MED ORDER — POTASSIUM CHLORIDE CRYS ER 20 MEQ PO TBCR
40.0000 meq | EXTENDED_RELEASE_TABLET | Freq: Once | ORAL | Status: AC
  Administered 2023-11-20: 40 meq via ORAL
  Filled 2023-11-20: qty 2

## 2023-11-20 MED ORDER — LEVOFLOXACIN 750 MG PO TABS
750.0000 mg | ORAL_TABLET | Freq: Every day | ORAL | Status: DC
  Administered 2023-11-20 – 2023-11-21 (×2): 750 mg via ORAL
  Filled 2023-11-20 (×2): qty 1

## 2023-11-20 MED ORDER — ACETAMINOPHEN 325 MG PO TABS: 650.0000 mg | ORAL_TABLET | Freq: Four times a day (QID) | ORAL | Status: DC | PRN

## 2023-11-20 MED ORDER — BUTALBITAL-APAP-CAFFEINE 50-325-40 MG PO TABS
1.0000 | ORAL_TABLET | ORAL | Status: AC | PRN
  Administered 2023-11-20 – 2023-11-21 (×3): 1 via ORAL
  Filled 2023-11-20 (×4): qty 1

## 2023-11-20 NOTE — Plan of Care (Signed)

## 2023-11-20 NOTE — Progress Notes (Signed)
 PROGRESS NOTE    Taylor Hood  WUJ:[id number] DOB: [birth year] DOA: 11/18/2023 PCP: Jeanene Milder, FNP   Brief Narrative:  This 38 year old female with history of diabetes lupus and hyperlipidemia admitted with pleuritic chest pain cough and fever.  Patient underwent CT scan which showed pneumonia.  Patient was hospitalized for further management.   Assessment & Plan:   Community-acquired pneumonia/sepsis present on admission  COVID-19 PCR was negative.  CT scan showed right lung pneumonia. WBC has improved.  Fevers have subsided. She is not requiring any oxygen.  Still has a cough with productive sputum. Will change antibiotics to oral today. Continue with incentive spirometry.  Mobilize.  Essential hypertension Patient was continued on labetalol .  Noted to be on a once a day dose.  Ideally should be twice a day.  Blood pressure not optimally controlled.  Will change labetalol  to 200 mg twice a day.    Type 2 diabetes HbA1c 7.1.  Patient noted to be on SSI and glargine.  Prior to admission she was on Lantus .  Monitor CBGs.   Abnormal LFTs Mildly elevated AST and ALT level noted.  Possibly due to acute illness.  Recheck labs in the morning.  Abdomen as per examination.  Hypokalemia Supplemented.  Check magnesium level.  Hyperlipidemia Continue statin   DVT prophylaxis: lovenox  Code Status:full Family Communication: none Disposition Plan: Anticipate discharge tomorrow   Consultants: none  Procedures: none Antimicrobials: rocephin  azithro  Subjective: Chest pain is improved.  Continues to have a cough.  No nausea vomiting.  Will try and ambulate in the hallway today.  Objective: Vitals:   11/19/23 2043 11/20/23 0040 11/20/23 0426 11/20/23 0735  BP: (!) 167/103 (!) 157/98 (!) 147/102 (!) 151/100  Pulse: 95 96 86 84  Resp: 18 17 18    Temp: 99.3 F (37.4 C) 98.9 F (37.2 C) 98 F (36.7 C) 98.6 F (37 C)  TempSrc:  Oral  Oral  SpO2: 97% 98% 94% 98%   Weight:      Height:        Intake/Output Summary (Last 24 hours) at 11/20/2023 1029 Last data filed at 11/20/2023 0908 Gross per 24 hour  Intake 720 ml  Output --  Net 720 ml   Filed Weights   11/18/23 1018  Weight: 83 kg    Examination:  General appearance: Awake alert.  In no distress Resp: Normal effort at rest.  Crackles right lung base.  No wheezing.  Rhonchi appreciated. Cardio: S1-S2 is normal regular.  No S3-S4.  No rubs murmurs or bruit GI: Abdomen is soft.  Nontender nondistended.  Bowel sounds are present normal.  No masses organomegaly Extremities: No edema.  Full range of motion of lower extremities. Neurologic: Alert and oriented x3.  No focal neurological deficits.    Data Reviewed: I have personally reviewed following labs and imaging studies  CBC: Recent Labs  Lab 11/18/23 1027 11/19/23 1253 11/20/23 0500  WBC 12.5* 9.1 6.9  HGB 11.6* 11.3* 10.1*  HCT 35.3* 33.8* 30.7*  MCV 91.7 88.5 89.2  PLT 278 279 271   Basic Metabolic Panel: Recent Labs  Lab 11/18/23 1027 11/19/23 1253 11/20/23 0500  NA 136 135 137  K 3.2* 3.4* 3.4*  CL 103 102 109  CO2 23 20* 21*  GLUCOSE 173* 145* 143*  BUN 10 8 7   CREATININE 0.94 0.75 0.66  CALCIUM  9.0 9.2 8.1*   GFR: Estimated Creatinine Clearance: 104.6 mL/min (by C-G formula based on SCr of 0.66 mg/dL). Liver Function  Tests: Recent Labs  Lab 11/19/23 1253 11/20/23 0500  AST 26 62*  ALT 30 95*  ALKPHOS 46 44  BILITOT 0.5 <0.2  PROT 7.7 6.9  ALBUMIN 3.6 3.1*    Coagulation Profile: Recent Labs  Lab 11/19/23 1254  INR 1.2   Cardiac Enzymes: Recent Labs  Lab 11/18/23 1755  CKTOTAL 196   HbA1C: Recent Labs    11/18/23 1847  HGBA1C 7.1*   CBG: Recent Labs  Lab 11/19/23 0859 11/19/23 1303 11/19/23 1739 11/19/23 2151 11/20/23 0603  GLUCAP 260* 165* 181* 196* 146*   Sepsis Labs: Recent Labs  Lab 11/19/23 1505 11/19/23 1715  LATICACIDVEN 1.7 1.7    Recent Results (from the  past 240 hours)  Resp panel by RT-PCR (RSV, Flu A&B, Covid) Anterior Nasal Swab     Status: None   Collection Time: 11/18/23  6:59 PM   Specimen: Anterior Nasal Swab  Result Value Ref Range Status   SARS Coronavirus 2 by RT PCR NEGATIVE NEGATIVE Final   Influenza A by PCR NEGATIVE NEGATIVE Final   Influenza B by PCR NEGATIVE NEGATIVE Final    Comment: (NOTE) The Xpert Xpress SARS-CoV-2/FLU/RSV plus assay is intended as an aid in the diagnosis of influenza from Nasopharyngeal swab specimens and should not be used as a sole basis for treatment. Nasal washings and aspirates are unacceptable for Xpert Xpress SARS-CoV-2/FLU/RSV testing.  Fact Sheet for Patients: BloggerCourse.com  Fact Sheet for Healthcare Providers: SeriousBroker.it  This test is not yet approved or cleared by the [place]  FDA and has been authorized for detection and/or diagnosis of SARS-CoV-2 by FDA under an Emergency Use Authorization (EUA). This EUA will remain in effect (meaning this test can be used) for the duration of the COVID-19 declaration under Section 564(b)(1) of the Act, 21 U.S.C. section 360bbb-3(b)(1), unless the authorization is terminated or revoked.     Resp Syncytial Virus by PCR NEGATIVE NEGATIVE Final    Comment: (NOTE) Fact Sheet for Patients: BloggerCourse.com  Fact Sheet for Healthcare Providers: SeriousBroker.it  This test is not yet approved or cleared by the [place]  FDA and has been authorized for detection and/or diagnosis of SARS-CoV-2 by FDA under an Emergency Use Authorization (EUA). This EUA will remain in effect (meaning this test can be used) for the duration of the COVID-19 declaration under Section 564(b)(1) of the Act, 21 U.S.C. section 360bbb-3(b)(1), unless the authorization is terminated or revoked.  Performed at [facility], [address],  [place], [name] [address]   Culture, blood (Routine X 2) w Reflex to ID Panel     Status: None (Preliminary result)   Collection Time: 11/18/23  7:49 PM   Specimen: BLOOD  Result Value Ref Range Status   Specimen Description BLOOD SITE NOT SPECIFIED  Final   Special Requests   Final    BOTTLES DRAWN AEROBIC AND ANAEROBIC Blood Culture results may not be optimal due to an inadequate volume of blood received in culture bottles   Culture   Final    NO GROWTH 2 DAYS Performed at [facility], [address], [place], [name] [address]    Report Status PENDING  Incomplete  Culture, blood (Routine X 2) w Reflex to ID Panel     Status: None (Preliminary result)   Collection Time: 11/18/23  7:50 PM   Specimen: BLOOD  Result Value Ref Range Status   Specimen Description BLOOD SITE NOT SPECIFIED  Final   Special Requests   Final  BOTTLES DRAWN AEROBIC AND ANAEROBIC Blood Culture results may not be optimal due to an inadequate volume of blood received in culture bottles   Culture   Final    NO GROWTH 2 DAYS Performed at [facility], [address], [place], [name] [address]    Report Status PENDING  Incomplete         Radiology Studies: CT Angio Chest PE W and/or Wo Contrast Result Date: 11/18/2023 CLINICAL DATA:  High probability for PE EXAM: CT ANGIOGRAPHY CHEST WITH CONTRAST TECHNIQUE: Multidetector CT imaging of the chest was performed using the standard protocol during bolus administration of intravenous contrast. Multiplanar CT image reconstructions and MIPs were obtained to evaluate the vascular anatomy. RADIATION DOSE REDUCTION: This exam was performed according to the departmental dose-optimization program which includes automated exposure control, adjustment of the mA and/or kV according to patient size and/or use of iterative reconstruction technique. CONTRAST:  75mL OMNIPAQUE  IOHEXOL  350 MG/ML SOLN COMPARISON:  CT PE protocol 05/15/2007. FINDINGS: Cardiovascular:  Satisfactory opacification of the pulmonary arteries to the segmental level. No evidence of pulmonary embolism. Normal heart size. No pericardial effusion. Mediastinum/Nodes: There are prominent right hilar and subcarinal lymph nodes. Visualized esophagus and thyroid  gland are within normal limits. The Lungs/Pleura: There is patchy airspace disease in the right lower lobe. There is no pleural effusion or pneumothorax. The lungs are otherwise clear. Upper Abdomen: No acute abnormality. Musculoskeletal: No chest wall abnormality. No acute or significant osseous findings. Review of the MIP images confirms the above findings. IMPRESSION: 1. No evidence for pulmonary embolism. 2. Patchy airspace disease in the right lower lobe compatible with pneumonia. 3. Prominent right hilar and subcarinal lymph nodes, likely reactive. Electronically Signed   By: Tyron Gallon M.D.   On: 11/18/2023 16:07   CT Head Wo Contrast Result Date: 11/18/2023 CLINICAL DATA:  Headache EXAM: CT HEAD WITHOUT CONTRAST TECHNIQUE: Contiguous axial images were obtained from the base of the skull through the vertex without intravenous contrast. RADIATION DOSE REDUCTION: This exam was performed according to the departmental dose-optimization program which includes automated exposure control, adjustment of the mA and/or kV according to patient size and/or use of iterative reconstruction technique. COMPARISON:  CT brain 10/13/2021 FINDINGS: Brain: No acute territorial infarction, hemorrhage or intracranial mass. The ventricles are nonenlarged. Vascular: No hyperdense vessel or unexpected calcification. Skull: Normal. Negative for fracture or focal lesion. Sinuses/Orbits: Mucous retention cyst in the left maxillary sinus Other: None IMPRESSION: Negative non contrasted CT appearance of the brain. Electronically Signed   By: Esmeralda Hedge M.D.   On: 11/18/2023 16:06   DG Chest 2 View Result Date: 11/18/2023 CLINICAL DATA:  Chest pain. EXAM: CHEST - 2  VIEW COMPARISON:  10/13/2021 FINDINGS: Normal-sized heart. Interval patchy density in the right lower lobe. Clear left lung. Unremarkable bones. IMPRESSION: Right lower lobe pneumonia. Pulmonary infarction is less likely but not excluded. Electronically Signed   By: Catherin Closs M.D.   On: 11/18/2023 12:40    Scheduled Meds:  enoxaparin  (LOVENOX ) injection  40 mg Subcutaneous Q24H   insulin  aspart  0-15 Units Subcutaneous TID WC   insulin  aspart  0-5 Units Subcutaneous QHS   insulin  glargine-yfgn  20 Units Subcutaneous QHS   labetalol   300 mg Oral Daily   levofloxacin  750 mg Oral Daily   oxyCODONE   10 mg Oral Once   potassium chloride   40 mEq Oral Once   rosuvastatin   20 mg Oral Daily   sodium chloride  flush  10-40  mL Intracatheter Q12H   sodium chloride  flush  3 mL Intravenous Q12H   sodium chloride  flush  3 mL Intravenous Q12H   Continuous Infusions:  sodium chloride  100 mL/hr at 11/20/23 0700     LOS: 2 days    Maylene Spear, MD  11/20/2023, 10:29 AM

## 2023-11-21 ENCOUNTER — Inpatient Hospital Stay (HOSPITAL_COMMUNITY)

## 2023-11-21 DIAGNOSIS — E119 Type 2 diabetes mellitus without complications: Secondary | ICD-10-CM | POA: Diagnosis not present

## 2023-11-21 DIAGNOSIS — Z794 Long term (current) use of insulin: Secondary | ICD-10-CM | POA: Diagnosis not present

## 2023-11-21 DIAGNOSIS — J189 Pneumonia, unspecified organism: Secondary | ICD-10-CM | POA: Diagnosis present

## 2023-11-21 DIAGNOSIS — I10 Essential (primary) hypertension: Secondary | ICD-10-CM | POA: Diagnosis not present

## 2023-11-21 DIAGNOSIS — R519 Headache, unspecified: Secondary | ICD-10-CM | POA: Diagnosis not present

## 2023-11-21 DIAGNOSIS — A419 Sepsis, unspecified organism: Secondary | ICD-10-CM | POA: Diagnosis not present

## 2023-11-21 LAB — COMPREHENSIVE METABOLIC PANEL WITH GFR
ALT: 111 U/L — ABNORMAL HIGH (ref 0–44)
AST: 62 U/L — ABNORMAL HIGH (ref 15–41)
Albumin: 3.3 g/dL — ABNORMAL LOW (ref 3.5–5.0)
Alkaline Phosphatase: 41 U/L (ref 38–126)
Anion gap: 11 (ref 5–15)
BUN: 6 mg/dL (ref 6–20)
CO2: 21 mmol/L — ABNORMAL LOW (ref 22–32)
Calcium: 8.9 mg/dL (ref 8.9–10.3)
Chloride: 106 mmol/L (ref 98–111)
Creatinine, Ser: 0.83 mg/dL (ref 0.44–1.00)
GFR, Estimated: >60 mL/min (ref >60)
Glucose, Bld: 116 mg/dL — ABNORMAL HIGH (ref 70–99)
Potassium: 3.3 mmol/L — ABNORMAL LOW (ref 3.5–5.1)
Sodium: 138 mmol/L (ref 135–145)
Total Bilirubin: 0.3 mg/dL (ref 0.0–1.2)
Total Protein: 7.3 g/dL (ref 6.5–8.1)

## 2023-11-21 LAB — MAGNESIUM: Magnesium: 2 mg/dL (ref 1.7–2.4)

## 2023-11-21 LAB — GLUCOSE, CAPILLARY
Glucose-Capillary: 112 mg/dL — ABNORMAL HIGH (ref 70–99)
Glucose-Capillary: 120 mg/dL — ABNORMAL HIGH (ref 70–99)

## 2023-11-21 LAB — HEPATITIS PANEL, ACUTE
HCV Ab: NONREACTIVE
Hep A IgM: NONREACTIVE
Hep B C IgM: NONREACTIVE
Hepatitis B Surface Ag: NONREACTIVE

## 2023-11-21 MED ORDER — BUTALBITAL-APAP-CAFFEINE 50-325-40 MG PO TABS: 1.0000 | ORAL_TABLET | Freq: Four times a day (QID) | ORAL | Status: DC | PRN

## 2023-11-21 MED ORDER — POTASSIUM CHLORIDE CRYS ER 20 MEQ PO TBCR
40.0000 meq | EXTENDED_RELEASE_TABLET | ORAL | Status: AC
  Administered 2023-11-21 (×2): 40 meq via ORAL
  Filled 2023-11-21 (×2): qty 2

## 2023-11-21 MED ORDER — LEVOFLOXACIN 750 MG PO TABS: 750.0000 mg | ORAL_TABLET | Freq: Every day | ORAL | 0 refills | Status: AC

## 2023-11-21 MED ORDER — BUTALBITAL-APAP-CAFFEINE 50-325-40 MG PO TABS
1.0000 | ORAL_TABLET | Freq: Four times a day (QID) | ORAL | 0 refills | Status: AC | PRN
Start: 2023-11-21 — End: ?

## 2023-11-21 MED ORDER — LABETALOL HCL 200 MG PO TABS: 200.0000 mg | ORAL_TABLET | Freq: Two times a day (BID) | ORAL | 2 refills | Status: DC

## 2023-11-21 NOTE — Plan of Care (Signed)

## 2023-11-21 NOTE — Progress Notes (Signed)
 Tina bedside nurse aware that she is able to remove the midline per policy.

## 2023-11-21 NOTE — Progress Notes (Signed)
 PROGRESS NOTE    Taylor Hood  ZOX:[id number] DOB: 02/21/1986 DOA: 11/18/2023 PCP: Jeanene Milder, FNP   Brief Narrative:  This 38 year old female with history of diabetes lupus and hyperlipidemia admitted with pleuritic chest pain cough and fever.  Patient underwent CT scan which showed pneumonia.  Patient was hospitalized for further management.   Assessment & Plan:   Community-acquired pneumonia/sepsis present on admission  COVID-19 PCR was negative.  CT scan showed right lung pneumonia. WBC has improved.  Fevers have subsided. She is not requiring any oxygen.   Patient was changed over to oral antibiotics.   Continue with incentive spirometry.  Mobilize.  Anticipate discharge later today.  Headache No neurological deficits noted.  Denies any falls or injuries recently.  Most likely due to her acute infection.  She was given Fioricet with only minimal improvement.  Will proceed with CT head to make sure there is no acute intracranial abnormality.  Essential hypertension Patient was continued on labetalol .  Noted to be on a once a day dose.  Ideally should be twice a day.  Labetalol  was changed over to twice a day dosing.  Her headache is contributing to elevated blood pressure readings.  Type 2 diabetes HbA1c 7.1.  Patient noted to be on SSI and glargine.  Prior to admission she was on Lantus .  Monitor CBGs.   Abnormal LFTs Mildly elevated AST and ALT level noted.  Possibly due to acute illness.  Relatively stable today.  Patient told to have this rechecked by her PCP in 1 to 2 weeks.  Hypokalemia Supplement.  Magnesium level is normal.  Hyperlipidemia Continue statin   DVT prophylaxis: lovenox  Code Status:full Family Communication: none Disposition Plan: Anticipate discharge tomorrow   Consultants: none  Procedures: none Antimicrobials: rocephin  azithro  Subjective: Shortness of breath and cough is improved.  Headache persists.  Looking forward to going home  later today.   Objective: Vitals:   11/20/23 1527 11/20/23 1529 11/20/23 1948 11/21/23 0439  BP: (!) 157/108 (!) 160/109 (!) 151/108 (!) 164/97  Pulse: 83  87 82  Resp:   16 16  Temp: 98.5 F (36.9 C)  98.5 F (36.9 C) 98.8 F (37.1 C)  TempSrc: Oral   Oral  SpO2: 98%  98% 97%  Weight:      Height:       No intake or output data in the 24 hours ending 11/21/23 1224  Filed Weights   11/18/23 1018  Weight: 83 kg    Examination:  General appearance: Awake alert.  In no distress Resp: Clear to auscultation bilaterally.  Normal effort Cardio: S1-S2 is normal regular.  No S3-S4.  No rubs murmurs or bruit GI: Abdomen is soft.  Nontender nondistended.  Bowel sounds are present normal.  No masses organomegaly   Data Reviewed: I have personally reviewed following labs and imaging studies  CBC: Recent Labs  Lab 11/18/23 1027 11/19/23 1253 11/20/23 0500  WBC 12.5* 9.1 6.9  HGB 11.6* 11.3* 10.1*  HCT 35.3* 33.8* 30.7*  MCV 91.7 88.5 89.2  PLT 278 279 271   Basic Metabolic Panel: Recent Labs  Lab 11/18/23 1027 11/19/23 1253 11/20/23 0500 11/21/23 0247  NA 136 135 137 138  K 3.2* 3.4* 3.4* 3.3*  CL 103 102 109 106  CO2 23 20* 21* 21*  GLUCOSE 173* 145* 143* 116*  BUN 10 8 7 6   CREATININE 0.94 0.75 0.66 0.83  CALCIUM  9.0 9.2 8.1* 8.9  MG  --   --   --  2.0   GFR: Estimated Creatinine Clearance: 100.8 mL/min (by C-G formula based on SCr of 0.83 mg/dL). Liver Function Tests: Recent Labs  Lab 11/19/23 1253 11/20/23 0500 11/21/23 0247  AST 26 62* 62*  ALT 30 95* 111*  ALKPHOS 46 44 41  BILITOT 0.5 <0.2 0.3  PROT 7.7 6.9 7.3  ALBUMIN 3.6 3.1* 3.3*    Coagulation Profile: Recent Labs  Lab 11/19/23 1254  INR 1.2   Cardiac Enzymes: Recent Labs  Lab 11/18/23 1755  CKTOTAL 196   HbA1C: Recent Labs    11/18/23 1847  HGBA1C 7.1*   CBG: Recent Labs  Lab 11/20/23 0603 11/20/23 1125 11/20/23 1640 11/20/23 2109 11/21/23 0624  GLUCAP 146* 211*  137* 121* 112*   Sepsis Labs: Recent Labs  Lab 11/19/23 1505 11/19/23 1715  LATICACIDVEN 1.7 1.7    Recent Results (from the past 240 hours)  Resp panel by RT-PCR (RSV, Flu A&B, Covid) Anterior Nasal Swab     Status: None   Collection Time: 11/18/23  6:59 PM   Specimen: Anterior Nasal Swab  Result Value Ref Range Status   SARS Coronavirus 2 by RT PCR NEGATIVE NEGATIVE Final   Influenza A by PCR NEGATIVE NEGATIVE Final   Influenza B by PCR NEGATIVE NEGATIVE Final    Comment: (NOTE) The Xpert Xpress SARS-CoV-2/FLU/RSV plus assay is intended as an aid in the diagnosis of influenza from Nasopharyngeal swab specimens and should not be used as a sole basis for treatment. Nasal washings and aspirates are unacceptable for Xpert Xpress SARS-CoV-2/FLU/RSV testing.  Fact Sheet for Patients: BloggerCourse.com  Fact Sheet for Healthcare Providers: SeriousBroker.it  This test is not yet approved or cleared by the [place]  FDA and has been authorized for detection and/or diagnosis of SARS-CoV-2 by FDA under an Emergency Use Authorization (EUA). This EUA will remain in effect (meaning this test can be used) for the duration of the COVID-19 declaration under Section 564(b)(1) of the Act, 21 U.S.C. section 360bbb-3(b)(1), unless the authorization is terminated or revoked.     Resp Syncytial Virus by PCR NEGATIVE NEGATIVE Final    Comment: (NOTE) Fact Sheet for Patients: BloggerCourse.com  Fact Sheet for Healthcare Providers: SeriousBroker.it  This test is not yet approved or cleared by the [place]  FDA and has been authorized for detection and/or diagnosis of SARS-CoV-2 by FDA under an Emergency Use Authorization (EUA). This EUA will remain in effect (meaning this test can be used) for the duration of the COVID-19 declaration under Section 564(b)(1) of the Act, 21  U.S.C. section 360bbb-3(b)(1), unless the authorization is terminated or revoked.  Performed at [facility], [address], [place], [name] [address]   Culture, blood (Routine X 2) w Reflex to ID Panel     Status: None (Preliminary result)   Collection Time: 11/18/23  7:49 PM   Specimen: BLOOD  Result Value Ref Range Status   Specimen Description BLOOD SITE NOT SPECIFIED  Final   Special Requests   Final    BOTTLES DRAWN AEROBIC AND ANAEROBIC Blood Culture results may not be optimal due to an inadequate volume of blood received in culture bottles   Culture   Final    NO GROWTH 3 DAYS Performed at [facility], [address] [place], [name] [address]    Report Status PENDING  Incomplete  Culture, blood (Routine X 2) w Reflex to ID Panel     Status: None (Preliminary result)   Collection Time: 11/18/23  7:50  PM   Specimen: BLOOD  Result Value Ref Range Status   Specimen Description BLOOD SITE NOT SPECIFIED  Final   Special Requests   Final    BOTTLES DRAWN AEROBIC AND ANAEROBIC Blood Culture results may not be optimal due to an inadequate volume of blood received in culture bottles   Culture   Final    NO GROWTH 3 DAYS Performed at [facility], [address] [place], [name] [address]    Report Status PENDING  Incomplete         Radiology Studies: No results found.   Scheduled Meds:  enoxaparin  (LOVENOX ) injection  40 mg Subcutaneous Q24H   insulin  aspart  0-15 Units Subcutaneous TID WC   insulin  aspart  0-5 Units Subcutaneous QHS   insulin  glargine-yfgn  20 Units Subcutaneous QHS   labetalol   200 mg Oral BID   levofloxacin  750 mg Oral Daily   potassium chloride   40 mEq Oral Q4H   rosuvastatin   20 mg Oral Daily   sodium chloride  flush  10-40 mL Intracatheter Q12H   sodium chloride  flush  3 mL Intravenous Q12H   sodium chloride  flush  3 mL Intravenous Q12H   Continuous Infusions:  sodium chloride  100 mL/hr at 11/21/23 0326     LOS: 3  days    Maylene Spear, MD  11/21/2023, 12:24 PM

## 2023-11-21 NOTE — Discharge Planning (Signed)
 Patient alert. Midline was removed by swot nurse. Discharge teaching given to patient. Patient verbalized understanding of teaching including medications. Patient requested work note. MD notified, Md provided note. Note was printed and put with discharge summary. Discharge summary and work note placed in discharge folder and put with patient belongings. Patient will be discharged home via family.

## 2023-11-21 NOTE — Discharge Summary (Signed)
 [facility]  Physician Discharge Summary   Patient ID: Taylor Hood MRN: [id number] DOB/AGE: 07-Sep-1985 38 y.o.  Admit date: 11/18/2023 Discharge date: 11/21/2023    PCP: Jeanene Milder, FNP  DISCHARGE DIAGNOSES:    Sepsis [facility])   Community acquired pneumonia of right lower lobe of lung   Diabetes mellitus (HCC)   Dyslipidemia   HTN (hypertension)   RECOMMENDATIONS FOR OUTPATIENT FOLLOW UP: Patient has to follow-up with her primary care provider to recheck her LFTs and other labs   Home Health: None Equipment/Devices: None  CODE STATUS: Full code  DISCHARGE CONDITION: fair  Diet recommendation: As before  INITIAL HISTORY: 38 year old female with history of diabetes lupus and hyperlipidemia admitted with pleuritic chest pain cough and fever. Patient underwent CT scan which showed pneumonia. Patient was hospitalized for further management.    HOSPITAL COURSE:   Community-acquired pneumonia/sepsis present on admission  COVID-19 PCR was negative.  CT scan showed right lung pneumonia. WBC has improved.  Fevers have subsided. She is not requiring any oxygen.   Patient was changed over to oral antibiotics.     Headache No neurological deficits noted.  Denies any falls or injuries recently.  Most likely due to her acute infection.  CT head was done which did not show any acute findings.  Patient was reassured.  Continue with Fioricet.     Essential hypertension Patient was continued on labetalol .  Noted to be on a once a day dose.  Ideally should be twice a day.  Labetalol  was changed over to twice a day dosing.  Her headache is contributing to elevated blood pressure readings.   Type 2 diabetes HbA1c 7.1.  Continue with home medication regimen   Abnormal LFTs Mildly elevated AST and ALT level noted.  Possibly due to acute illness.  Relatively stable today.  Patient told to have this rechecked by her PCP in 1 to 2 weeks. Hepatitis panel was unremarkable.    Hypokalemia Supplement.  Magnesium level is normal.   Hyperlipidemia Continue statin   Patient is stable.  Okay for discharge home today.   PERTINENT LABS:  The results of significant diagnostics from this hospitalization (including imaging, microbiology, ancillary and laboratory) are listed below for reference.    Microbiology: Recent Results (from the past 240 hours)  Resp panel by RT-PCR (RSV, Flu A&B, Covid) Anterior Nasal Swab     Status: None   Collection Time: 11/18/23  6:59 PM   Specimen: Anterior Nasal Swab  Result Value Ref Range Status   SARS Coronavirus 2 by RT PCR NEGATIVE NEGATIVE Final   Influenza A by PCR NEGATIVE NEGATIVE Final   Influenza B by PCR NEGATIVE NEGATIVE Final    Comment: (NOTE) The Xpert Xpress SARS-CoV-2/FLU/RSV plus assay is intended as an aid in the diagnosis of influenza from Nasopharyngeal swab specimens and should not be used as a sole basis for treatment. Nasal washings and aspirates are unacceptable for Xpert Xpress SARS-CoV-2/FLU/RSV testing.  Fact Sheet for Patients: BloggerCourse.com  Fact Sheet for Healthcare Providers: SeriousBroker.it  This test is not yet approved or cleared by the [place]  FDA and has been authorized for detection and/or diagnosis of SARS-CoV-2 by FDA under an Emergency Use Authorization (EUA). This EUA will remain in effect (meaning this test can be used) for the duration of the COVID-19 declaration under Section 564(b)(1) of the Act, 21 U.S.C. section 360bbb-3(b)(1), unless the authorization is terminated or revoked.     Resp Syncytial Virus by PCR NEGATIVE NEGATIVE  Final    Comment: (NOTE) Fact Sheet for Patients: BloggerCourse.com  Fact Sheet for Healthcare Providers: SeriousBroker.it  This test is not yet approved or cleared by the [place]  FDA and has been authorized for detection and/or  diagnosis of SARS-CoV-2 by FDA under an Emergency Use Authorization (EUA). This EUA will remain in effect (meaning this test can be used) for the duration of the COVID-19 declaration under Section 564(b)(1) of the Act, 21 U.S.C. section 360bbb-3(b)(1), unless the authorization is terminated or revoked.  Performed at [facility], [address], [place], [name] [address]   Culture, blood (Routine X 2) w Reflex to ID Panel     Status: None (Preliminary result)   Collection Time: 11/18/23  7:49 PM   Specimen: BLOOD  Result Value Ref Range Status   Specimen Description BLOOD SITE NOT SPECIFIED  Final   Special Requests   Final    BOTTLES DRAWN AEROBIC AND ANAEROBIC Blood Culture results may not be optimal due to an inadequate volume of blood received in culture bottles   Culture   Final    NO GROWTH 4 DAYS Performed at [facility], [address], [place], [name] [address]    Report Status PENDING  Incomplete  Culture, blood (Routine X 2) w Reflex to ID Panel     Status: None (Preliminary result)   Collection Time: 11/18/23  7:50 PM   Specimen: BLOOD  Result Value Ref Range Status   Specimen Description BLOOD SITE NOT SPECIFIED  Final   Special Requests   Final    BOTTLES DRAWN AEROBIC AND ANAEROBIC Blood Culture results may not be optimal due to an inadequate volume of blood received in culture bottles   Culture   Final    NO GROWTH 4 DAYS Performed at [facility], [address], [place], [name] [address]    Report Status PENDING  Incomplete     Labs:   Basic Metabolic Panel: Recent Labs  Lab 11/18/23 1027 11/19/23 1253 11/20/23 0500 11/21/23 0247  NA 136 135 137 138  K 3.2* 3.4* 3.4* 3.3*  CL 103 102 109 106  CO2 23 20* 21* 21*  GLUCOSE 173* 145* 143* 116*  BUN 10 8 7 6   CREATININE 0.94 0.75 0.66 0.83  CALCIUM  9.0 9.2 8.1* 8.9  MG  --   --   --  2.0   Liver Function Tests: Recent Labs  Lab 11/19/23 1253 11/20/23 0500 11/21/23 0247   AST 26 62* 62*  ALT 30 95* 111*  ALKPHOS 46 44 41  BILITOT 0.5 <0.2 0.3  PROT 7.7 6.9 7.3  ALBUMIN 3.6 3.1* 3.3*    CBC: Recent Labs  Lab 11/18/23 1027 11/19/23 1253 11/20/23 0500  WBC 12.5* 9.1 6.9  HGB 11.6* 11.3* 10.1*  HCT 35.3* 33.8* 30.7*  MCV 91.7 88.5 89.2  PLT 278 279 271   Cardiac Enzymes: Recent Labs  Lab 11/18/23 1755  CKTOTAL 196   CBG: Recent Labs  Lab 11/20/23 1125 11/20/23 1640 11/20/23 2109 11/21/23 0624 11/21/23 1243  GLUCAP 211* 137* 121* 112* 120*     IMAGING STUDIES CT HEAD WO CONTRAST ( ) Result Date: 11/21/2023 CLINICAL DATA:  Headache, increasing frequency or severity. EXAM: CT HEAD WITHOUT CONTRAST TECHNIQUE: Contiguous axial images were obtained from the base of the skull through the vertex without intravenous contrast. RADIATION DOSE REDUCTION: This exam was performed according to the departmental dose-optimization program which includes automated exposure control, adjustment of the  mA and/or kV according to patient size and/or use of iterative reconstruction technique. COMPARISON:  Head CT 11/18/2023 FINDINGS: Brain: There is no evidence of an acute infarct, intracranial hemorrhage, mass, midline shift, or extra-axial fluid collection. Cerebral volume is normal. The ventricles are normal in size. Vascular: No hyperdense vessel. Skull: No fracture or suspicious lesion. Sinuses/Orbits: Partially visualized mucous retention cyst in the left maxillary sinus. Clear mastoid air cells. Unremarkable orbits. Other: None. IMPRESSION: Negative head CT. Electronically Signed   By: Aundra Lee M.D.   On: 11/21/2023 15:18   CT Angio Chest PE W and/or Wo Contrast Result Date: 11/18/2023 CLINICAL DATA:  High probability for PE EXAM: CT ANGIOGRAPHY CHEST WITH CONTRAST TECHNIQUE: Multidetector CT imaging of the chest was performed using the standard protocol during bolus administration of intravenous contrast. Multiplanar CT image reconstructions and MIPs  were obtained to evaluate the vascular anatomy. RADIATION DOSE REDUCTION: This exam was performed according to the departmental dose-optimization program which includes automated exposure control, adjustment of the mA and/or kV according to patient size and/or use of iterative reconstruction technique. CONTRAST:  75mL OMNIPAQUE  IOHEXOL  350 MG/ML SOLN COMPARISON:  CT PE protocol 05/15/2007. FINDINGS: Cardiovascular: Satisfactory opacification of the pulmonary arteries to the segmental level. No evidence of pulmonary embolism. Normal heart size. No pericardial effusion. Mediastinum/Nodes: There are prominent right hilar and subcarinal lymph nodes. Visualized esophagus and thyroid  gland are within normal limits. The Lungs/Pleura: There is patchy airspace disease in the right lower lobe. There is no pleural effusion or pneumothorax. The lungs are otherwise clear. Upper Abdomen: No acute abnormality. Musculoskeletal: No chest wall abnormality. No acute or significant osseous findings. Review of the MIP images confirms the above findings. IMPRESSION: 1. No evidence for pulmonary embolism. 2. Patchy airspace disease in the right lower lobe compatible with pneumonia. 3. Prominent right hilar and subcarinal lymph nodes, likely reactive. Electronically Signed   By: Tyron Gallon M.D.   On: 11/18/2023 16:07   CT Head Wo Contrast Result Date: 11/18/2023 CLINICAL DATA:  Headache EXAM: CT HEAD WITHOUT CONTRAST TECHNIQUE: Contiguous axial images were obtained from the base of the skull through the vertex without intravenous contrast. RADIATION DOSE REDUCTION: This exam was performed according to the departmental dose-optimization program which includes automated exposure control, adjustment of the mA and/or kV according to patient size and/or use of iterative reconstruction technique. COMPARISON:  CT brain 10/13/2021 FINDINGS: Brain: No acute territorial infarction, hemorrhage or intracranial mass. The ventricles are  nonenlarged. Vascular: No hyperdense vessel or unexpected calcification. Skull: Normal. Negative for fracture or focal lesion. Sinuses/Orbits: Mucous retention cyst in the left maxillary sinus Other: None IMPRESSION: Negative non contrasted CT appearance of the brain. Electronically Signed   By: Esmeralda Hedge M.D.   On: 11/18/2023 16:06   DG Chest 2 View Result Date: 11/18/2023 CLINICAL DATA:  Chest pain. EXAM: CHEST - 2 VIEW COMPARISON:  10/13/2021 FINDINGS: Normal-sized heart. Interval patchy density in the right lower lobe. Clear left lung. Unremarkable bones. IMPRESSION: Right lower lobe pneumonia. Pulmonary infarction is less likely but not excluded. Electronically Signed   By: Catherin Closs M.D.   On: 11/18/2023 12:40    DISCHARGE EXAMINATION: See progress note from earlier today  DISPOSITION: Home  Discharge Instructions     Call MD for:  difficulty breathing, headache or visual disturbances   Complete by: As directed    Call MD for:  extreme fatigue   Complete by: As directed    Call MD for:  persistant dizziness  or light-headedness   Complete by: As directed    Call MD for:  persistant nausea and vomiting   Complete by: As directed    Call MD for:  severe uncontrolled pain   Complete by: As directed    Call MD for:  temperature >100.4   Complete by: As directed    Diet - low sodium heart healthy   Complete by: As directed    Discharge instructions   Complete by: As directed    Please take your medications as prescribed.  Please be sure to follow-up with your PCP within 1 week after discharge.  You were cared for by a hospitalist during your hospital stay. If you have any questions about your discharge medications or the care you received while you were in the hospital after you are discharged, you can call the unit and asked to speak with the hospitalist on call if the hospitalist that took care of you is not available. Once you are discharged, your primary care physician will  handle any further medical issues. Please note that NO REFILLS for any discharge medications will be authorized once you are discharged, as it is imperative that you return to your primary care physician (or establish a relationship with a primary care physician if you do not have one) for your aftercare needs so that they can reassess your need for medications and monitor your lab values. If you do not have a primary care physician, you can call [phone] for a physician referral.   Increase activity slowly   Complete by: As directed          Allergies as of 11/21/2023       Reactions   Latex Itching, Rash        Medication List     TAKE these medications    butalbital-acetaminophen -caffeine 50-325-40 MG tablet Commonly known as: FIORICET Take 1 tablet by mouth every 6 (six) hours as needed for headache.   ibuprofen  800 MG tablet Commonly known as: ADVIL  Take 800 mg by mouth every 8 (eight) hours as needed for moderate pain (pain score 4-6).   insulin  glargine 100 UNIT/ML injection Commonly known as: LANTUS  Inject 30-50 Units into the skin at bedtime as needed (High blood sugar).   labetalol  200 MG tablet Commonly known as: NORMODYNE  Take 1 tablet (200 mg total) by mouth 2 (two) times daily. What changed:  medication strength how much to take when to take this   levofloxacin 750 MG tablet Commonly known as: LEVAQUIN Take 1 tablet (750 mg total) by mouth daily for 5 days.   NovoLOG  FlexPen 100 UNIT/ML FlexPen Generic drug: insulin  aspart Inject 8 Units into the skin 3 (three) times daily with meals. What changed:  how much to take when to take this reasons to take this   Ozempic (0.25 or 0.5 MG/DOSE) 2 MG/3ML Sopn Generic drug: Semaglutide(0.25 or 0.5MG /DOS) Inject 0.5 mg into the skin once a week.   rosuvastatin  20 MG tablet Commonly known as: CRESTOR  Take 20 mg by mouth daily.          Follow-up Information     Jeanene Milder, FNP Follow up on  12/03/2023.   Specialty: Nurse Practitioner Why: 10 am Contact information: [address] [name] [address] [phone]                 TOTAL DISCHARGE TIME: 35 minutes  Montavious Wierzba  [facility] Pager on [email or web address]  11/22/2023, 10:44 AM

## 2023-11-23 LAB — CULTURE, BLOOD (ROUTINE X 2)
Culture: NO GROWTH
Culture: NO GROWTH

## 2023-12-03 DIAGNOSIS — G4489 Other headache syndrome: Secondary | ICD-10-CM | POA: Diagnosis not present

## 2023-12-03 DIAGNOSIS — R051 Acute cough: Secondary | ICD-10-CM | POA: Diagnosis not present

## 2023-12-03 DIAGNOSIS — R350 Frequency of micturition: Secondary | ICD-10-CM | POA: Diagnosis not present

## 2023-12-03 DIAGNOSIS — J189 Pneumonia, unspecified organism: Secondary | ICD-10-CM | POA: Diagnosis not present

## 2023-12-03 DIAGNOSIS — R748 Abnormal levels of other serum enzymes: Secondary | ICD-10-CM | POA: Diagnosis not present

## 2023-12-03 NOTE — Progress Notes (Signed)
 Payor: [place] [employer] / Plan:  MEDICAID [place] / Product Type: Managed Medicaid /   History of Present Illness   History of Present Illness The patient presents for evaluation of post-pneumonia cough, diabetes mellitus, migraines, and arthritis.  She was diagnosed with pneumonia on 11/18/2023, following an emergency room visit due to headache, chest tightness, and shortness of breath. A head CT scan was negative, but a chest x-ray revealed right lung pneumonia. She was prescribed Levaquin  750 mg once daily for 5 days and discharged on the subsequent Thursday. Currently, she reports no fever or chills but experiences shortness of breath during exertion, such as climbing stairs or walking from the kitchen to the living room. She also reports a persistent cough, for which she is not currently taking any medication.  While in the emergency department, she was found to have slightly elevated liver enzymes and recommended recheck with PCP.  Denies daily alcohol/Tylenol  use, diarrhea, abdominal discomfort, skin color changes.  She has been experiencing daily migraines, which have been particularly severe for the past 2 to 3 days. She describes her migraines as full headaches with light and sound sensitivity, pressure, and a squeezing sensation in the front of her head that radiates to the base of her neck. She does not experience nausea or vomiting. She also reports seeing spots and worsening vision, even with glasses, which she attributes to her diabetes. She has been taking Fioricet  every 6 hours as needed, which provides some relief. She also takes labetalol  400 mg twice daily, although she occasionally forgets the nighttime dose. She has been using ibuprofen  as needed for her migraines, which she finds helpful. She has been using peppermint and lemon honey house for relief.  She has been experiencing increased urinary frequency and urgency, with difficulty holding her urine until she reaches the  bathroom. She reports no burning sensation during urination. She has noticed bubbles in her urine, which she attributes to her diabetes. She has an upcoming appointment with her endocrinologist on 12/18/2023.    Review of Systems  Constitutional:  Negative for chills, fatigue and fever.  Respiratory:  Positive for cough. Negative for apnea, choking, chest tightness, shortness of breath, wheezing and stridor.   Neurological:  Positive for headaches.  All other systems reviewed and are negative.   Past Contributory History   Current Outpatient Medications  Medication Sig Dispense Refill  . albuterol  HFA (PROVENTIL  HFA;VENTOLIN  HFA;PROAIR  HFA) 90 mcg/actuation inhaler Inhale 2 puffs every 6 (six) hours as needed for wheezing. 18 g 1  . ammonium lactate (LAC-HYDRIN) 12 % lotion Apply externally to affected area as directed for dry skin   2  . blood-glucose sensor (FreeStyle Libre 3 Sensor) Inject 1 sensor to the skin every 14 days for continuous glucose monitoring. 6 kit 3  . butalbital -acetaminophen -caffeine  (FIORICET ) 50-325-40 mg per tablet Take 1 tablet by mouth.    . insulin  aspart U-100 (NovoLOG  FlexPen) 100 unit/mL (3 mL) pen Use as directed 3 times daily. Max daily dose is 50 units. 45 mL 1  . insulin  glargine (LANTUS ) 100 unit/mL injection Inject 30-50 Units under the skin.    . medroxyPROGESTERone (Depo-Provera) 150 mg/mL syringe Inject 1 mL (150 mg total) into the muscle every 3 (three) months. 1 mL 5  . pen needle, diabetic 32 gauge x 5/32 ndle Use as directed 4 times daily. 400 each 3  . rosuvastatin  (CRESTOR ) 20 mg tablet Take 1 tablet (20 mg total) by mouth daily. 90 tablet 3  . semaglutide (  Ozempic) 0.25 mg or 0.5 mg (2 mg/3 mL) subcutaneous pen injector Inject 0.5 mg under the skin every 7 days. 9 mL 1  . benzonatate  (TESSALON ) 200 mg capsule Take 1 capsule (200 mg total) by mouth 2 (two) times a day as needed for cough. 30 capsule 0  . ibuprofen  (MOTRIN ) 800 mg tablet Take 1  tablet (800 mg total) by mouth every 8 (eight) hours as needed for moderate pain (4-6). 90 tablet 0  . labetaloL  (NORMODYNE ) 200 mg tablet Take 400 mg by mouth 2 (two) times a day for 90 days. 360 tablet 3   No current facility-administered medications for this visit.   Allergies  Allergen Reactions  . Latex Itching and Rash   Past Medical History:  Diagnosis Date  . Abnormal Pap smear of cervix   . Diabetes mellitus    (CMD)   . Hypertension    Past Surgical History:  Procedure Laterality Date  . CESAREAN SECTION, LOW TRANSVERSE    . COLONOSCOPY    . COLPOSCOPY     Family History  Problem Relation Name Age of Onset  . Diabetes Maternal Grandmother    . Diabetes Mother    . Breast cancer Neg Hx    . Ovarian cancer Neg Hx        Physical Exam   Vitals:   12/03/23 1043  BP: 124/86  Pulse: 71  Resp: 14  Temp: 98.7 F (37.1 C)  TempSrc: Oral  SpO2: 98%  Weight: 81.2 kg (179 lb)  Height: 1.664 m (5' 5.5)   Physical Exam Vitals and nursing note reviewed.  Constitutional:      General: She is awake. She is not in acute distress.    Appearance: Normal appearance. She is not ill-appearing, toxic-appearing or diaphoretic.  HENT:     Head: Normocephalic and atraumatic.     Jaw: No trismus.     Right Ear: Tympanic membrane, ear canal and external ear normal. No hemotympanum.     Left Ear: Tympanic membrane, ear canal and external ear normal. No hemotympanum.     Nose: Nose normal.     Mouth/Throat:     Lips: Pink.     Mouth: Mucous membranes are moist.     Pharynx: Oropharynx is clear. Uvula midline.  Eyes:     General: Lids are normal.     Extraocular Movements: Extraocular movements intact.     Right eye: Normal extraocular motion and no nystagmus.     Left eye: Normal extraocular motion and no nystagmus.     Pupils: Pupils are equal, round, and reactive to light.  Cardiovascular:     Rate and Rhythm: Normal rate and regular rhythm.  Pulmonary:     Effort:  Pulmonary effort is normal. No respiratory distress.     Breath sounds: Normal breath sounds. No stridor. No wheezing, rhonchi or rales.  Chest:     Chest wall: No tenderness.  Abdominal:     General: Abdomen is flat. Bowel sounds are normal.     Palpations: Abdomen is soft.     Tenderness: There is no abdominal tenderness. There is no right CVA tenderness, left CVA tenderness, guarding or rebound. Negative signs include Murphy's sign, Rovsing's sign, McBurney's sign, psoas sign and obturator sign.  Musculoskeletal:     Cervical back: Full passive range of motion without pain, normal range of motion and neck supple.  Neurological:     General: No focal deficit present.     Mental Status:  She is alert and oriented to person, place, and time.     Deep Tendon Reflexes:     Reflex Scores:      Patellar reflexes are 2+ on the right side and 2+ on the left side.    Comments:  II-Visual fields grossly intact. III/IV/VI-Extraocular movements intact.  Pupils reactive bilaterally. V/VII-Smile symmetric, equal eyebrow raise,  facial sensation intact VIII- Hearing grossly intact IX/X-Normal gag XI-bilateral shoulder shrug XII-midline tongue extension Motor: 5/5 bilaterally with normal tone and bulk Cerebellar: Normal finger-to-nose  and normal heel-to-shin test.   Romberg negative Ambulates with a coordinated gait    Psychiatric:        Behavior: Behavior is cooperative.     Procedures   Procedures  Results   Lab results: Recent Results (from the past 24 hours)  Comprehensive Metabolic Panel   Collection Time: 12/03/23 11:27 AM  Result Value Ref Range   Sodium 137 136 - 145 mmol/L   Potassium 3.4 3.4 - 4.5 mmol/L   Chloride 104 98 - 107 mmol/L   CO2 23 21 - 31 mmol/L   Anion Gap 10 6 - 14 mmol/L   Glucose, Random 207 (H) 70 - 99 mg/dL   Blood Urea Nitrogen (BUN) 15 7 - 25 mg/dL   Creatinine 9.11 9.39 - 1.20 mg/dL   eGFR 87 >40 fO/fpw/8.26f7   Albumin 4.7 3.5 - 5.7 g/dL    Total Protein 8.0 6.4 - 8.9 g/dL   Bilirubin, Total 0.3 0.3 - 1.0 mg/dL   Alkaline Phosphatase (ALP) 57 34 - 104 U/L   Aspartate Aminotransferase (AST) 20 13 - 39 U/L   Alanine Aminotransferase (ALT) 41 7 - 52 U/L   Calcium  9.7 8.6 - 10.3 mg/dL   BUN/Creatinine Ratio    Urinalysis with Reflex to Microscopic   Collection Time: 12/03/23 11:27 AM  Result Value Ref Range   Color, Urine Yellow Yellow   Clarity, Urine Cloudy (A) Clear   Specific Gravity, Urine 1.036 (H) 1.005 - 1.025   pH, Urine 6.0 5.0 - 8.0   Protein, Urine 70 (A) Negative mg/dL   Glucose, Urine 50 (A) Negative mg/dL   Ketones, Urine Negative Negative mg/dL   Bilirubin, Urine Negative Negative   Blood, Urine 2+ (A) Negative   Nitrite, Urine Negative Negative   Leukocyte Esterase, Urine Negative Negative, 25   Urobilinogen, Urine Normal <2.0 mg/dL   WBC, Urine None Seen <6 /HPF   RBC, Urine 6-10 (A) 0 - 2 /HPF   Bacteria, Urine None Seen None Seen, Rare /HPF   Squamous Epithelial Cells, Urine 6-10 (A) 0 - 5 /HPF    X-ray results: Radiology Results (last 7 days)     ** No results found for the last 168 hours. **      Diagnosis & Disposition   1. Community acquired pneumonia of right lung, unspecified part of lung      2. Other headache syndrome      3. Elevated liver enzymes  Comprehensive Metabolic Panel    4. Acute cough  benzonatate  (TESSALON ) 200 mg capsule    5. Urinary frequency  Urinalysis with Reflex to Microscopic      Assessment & Plan 1.  Community-acquired pneumonia-improving -Still experiencing a dry nonproductive cough, but otherwise normal exam. - No fever, chills, or shortness of breath unless exerting herself. - Pulse oximetry at 98%, lungs sound clear on the right side. -Would consider repeat chest x-ray in 4 weeks if symptoms have not completely resolved. -  Tessalon  Perles prescribed, to be taken twice daily as needed. If symptoms completely resolve within the next 4 weeks, a repeat  chest x-ray will not be necessary.  2.  Headaches. - Complaining of frequent headaches for the past several months, but worse since hospitalization.  She has a nonfocal neurological exam on clinic. - She recently got glasses new glasses around the same time symptoms began a few months ago.  Encouraged her to switch back to her old glasses to see if she can see better/follow-up with ophthalmology for repeat eye exam if she feels like she is straining to read. - Continue taking ibuprofen  and Fioricet  as needed.  3.  Elevated liver enzymes -Will check CMP today to ensure they have resumed back to normal  4.  Acute cough - Likely related to recent pneumonia.  Encourage Tessalon  Perles as needed.  5.  Urinary frequency - Complaining of longstanding urinary frequency, but denies dysuria, pelvic pain, fever, chills, flank pain, hematuria.  UA is unremarkable.  Consistent with likely OAB.  Discussed Kegel exercises.   Counseled regarding condition(s) and all patient questions answered. If a new prescription was given today, then I discussed potential side effects, drug interactions, and instructions for taking the medication. Reviewed worrisome signs and symptoms to watch for and instructed patient to seek medical attention for any worsening, prolonged, changing, or new symptoms. I have discussed the signs and symptoms that would warrant proceeding to an emergency department.  All questions have been answered and the patient has voiced understanding and agreement with the plan of care.   Return in about 3 months (around 03/04/2024) for Schedule physical.  There are no Patient Instructions on file for this visit.   This documented history was created with the aid of Dragon dictation software as well as [employer], an automated transcribing artificial intelligence program, with light post-production editing.

## 2023-12-12 DIAGNOSIS — Z419 Encounter for procedure for purposes other than remedying health state, unspecified: Secondary | ICD-10-CM | POA: Diagnosis not present

## 2023-12-13 NOTE — Telephone Encounter (Signed)
 Medication Access Center Summary  PA Status Approved  Medication FreeStyle [facility] Plus Sensor  PA Approval Dates   [place] Number 12/13/2023-06/10/2024  [id number]  Provider Dhaval Patel  Insurance Company [facility] Medicaid   Approval letter saved to Media tab

## 2023-12-18 DIAGNOSIS — E782 Mixed hyperlipidemia: Secondary | ICD-10-CM | POA: Diagnosis not present

## 2023-12-18 DIAGNOSIS — E119 Type 2 diabetes mellitus without complications: Secondary | ICD-10-CM | POA: Diagnosis not present

## 2023-12-18 DIAGNOSIS — I10 Essential (primary) hypertension: Secondary | ICD-10-CM | POA: Diagnosis not present

## 2023-12-18 DIAGNOSIS — Z794 Long term (current) use of insulin: Secondary | ICD-10-CM | POA: Diagnosis not present

## 2023-12-18 NOTE — Progress Notes (Signed)
 This Document was created using the aid of voice recognition Dragon dictation software.  HPI: Diabetes type 2: Patient's diabetes control is worsening.   Patient reports that she was admitted to the hospital in May.  Patient ran out of Ozempic and she was not taking it for 3 to 4 weeks.  This is responsible for worsening of blood sugar control.  Patient reports that she was started on Lantus  insulin  in the hospital which she took instead of the Ozempic.  Patient has restarted taking the Ozempic since last 2 weeks.  Patient is currently taking 0.5 mg once a week.  Patient is also taking NovoLog  as needed to bring the blood sugars down. Patient is using Freestyle libre CGM. I have reviewed and interpreted the Freestyle libre CGM. The report is scanned in media section of the chart.   Patient's blood sugars are ranging from 60s to 350s range.  Average blood sugars are 244.  Only 23% of the readings are within target range.  Most of the blood sugars are elevated.  Patient has some postprandial hyperglycemia after meals.  Patient has occasional hypoglycemia episodes with blood sugars dropping to 50s to 60s range. Patient denies any nausea or vomiting.  Diet/Exercise: Patient is trying to follow diet.   Hyperlipidemia: The patient's cholesterol is well-controlled.   The following portions of the patient's history were reviewed and updated as appropriate: allergies, current medications, past medical history, past social history and problem list.  Current Medications[1]  Allergies[2]  Patient Active Problem List   Diagnosis Date Noted   . Pain of right breast 01/11/2023  . Generalized abdominal pain 05/29/2022  . Mixed hyperlipidemia 10/12/2021  . Diabetic ketoacidosis without coma associated with type 2 diabetes mellitus (HCC) 07/24/2021  . Diabetic ketoacidosis (HCC) 07/07/2021  . Type 2 diabetes mellitus without complication, with long-term current use of insulin  (HCC) 07/06/2021  . Essential  hypertension 07/06/2021  . Encounter for gynecological examination without abnormal finding 08/30/2020    Overview Note:     Pap with HPV up to date. Next pap due 2028 Depo for contraception    . Hypoglycemia 12/11/2019  . On Depo-Provera for contraception 12/04/2019    Overview Note:    Pleased with Depo Refilled.     . Other headache syndrome 11/12/2019  . Subcutaneous nodules 06/12/2016    Overview Note:    Fingers of Lt hand most prominent.    SABRA Positive ANA (antinuclear antibody) 06/12/2016    Overview Note:    Dilution of 1:320 with Diffuse pattern.  Test obtained for evaluation of  subcutaneous skin nodule, skin changes (thickening), and extremity edema.   Referrals made to both Dermatology and Rheumatology.      Resolved Problems  No resolved problems to display.    ROS: Appropriate review of system was done and it was negative except for as mentioned in HPI.   VITALS: Vitals:   12/18/23 0929  BP: 127/81  Pulse: 93    LABS/RADIOLOGY/MEDICAL RECORDS:   HbA1c is 9.4 today which is worse than last time Renal function electrolytes are okay.    Recent Results (from the past 16 weeks)  Comprehensive Metabolic Panel   Collection Time: 12/03/23 11:27 AM  Result Value Ref Range   Sodium 137 136 - 145 mmol/L   Potassium 3.4 3.4 - 4.5 mmol/L   Chloride 104 98 - 107 mmol/L   CO2 23 21 - 31 mmol/L   Anion Gap 10 6 - 14 mmol/L   Glucose, Random  207 (H) 70 - 99 mg/dL   Blood Urea Nitrogen (BUN) 15 7 - 25 mg/dL   Creatinine 9.11 9.39 - 1.20 mg/dL   eGFR 87 >40 fO/fpw/8.26f7   Albumin 4.7 3.5 - 5.7 g/dL   Total Protein 8.0 6.4 - 8.9 g/dL   Bilirubin, Total 0.3 0.3 - 1.0 mg/dL   Alkaline Phosphatase (ALP) 57 34 - 104 U/L   Aspartate Aminotransferase (AST) 20 13 - 39 U/L   Alanine Aminotransferase (ALT) 41 7 - 52 U/L   Calcium  9.7 8.6 - 10.3 mg/dL   BUN/Creatinine Ratio    Urinalysis with Reflex to Microscopic   Collection Time: 12/03/23 11:27 AM  Result  Value Ref Range   Color, Urine Yellow Yellow   Clarity, Urine Cloudy (A) Clear   Specific Gravity, Urine 1.036 (H) 1.005 - 1.025   pH, Urine 6.0 5.0 - 8.0   Protein, Urine 70 (A) Negative mg/dL   Glucose, Urine 50 (A) Negative mg/dL   Ketones, Urine Negative Negative mg/dL   Bilirubin, Urine Negative Negative   Blood, Urine 2+ (A) Negative   Nitrite, Urine Negative Negative   Leukocyte Esterase, Urine Negative Negative, 25   Urobilinogen, Urine Normal <2.0 mg/dL   WBC, Urine None Seen <6 /HPF   RBC, Urine 6-10 (A) 0 - 2 /HPF   Bacteria, Urine None Seen None Seen, Rare /HPF   Squamous Epithelial Cells, Urine 6-10 (A) 0 - 5 /HPF  POC Hemoglobin A1c   Collection Time: 12/18/23  9:30 AM  Result Value Ref Range   Hemoglobin A1c (POC) 9.4 (A) 4.2 - 5.6 %   A1C Information      A1C Diagnostic Criteria: Prediabetes:5.7% - 6.4% Diabetes:>=6.5% A1C Glycemic Goals: Older Adults: <7.0% Children and Adolescents: <7.0% Pregnant Women: <6.0%   Kit/Device Lot # [id number]    Kit/Device Expiration Date 09/09/25   POC Glucose (Hemocue/NOVA)   Collection Time: 12/18/23  9:32 AM  Result Value Ref Range   Glucose, POC 255 (A) 70 - 125 mg/dL   Kit/Device Lot # [id number]    Kit/Device Expiration Date 02/24/25      ORDERS: Orders Placed This Encounter  Procedures  . Lipid Panel  . TSH  . T4, Free  . Comprehensive Metabolic Panel  . Albumin, Random Urine  . POC Glucose (Hemocue/NOVA)  . POC Hemoglobin A1c   Orders Placed This Encounter  Medications  . insulin  aspart U-100 (NovoLOG  FlexPen) 100 unit/mL (3 mL) pen    Sig: Use as directed 3 times daily. Max daily dose is 50 units.    Dispense:  45 mL    Refill:  1  . insulin  glargine (LANTUS ) 100 unit/mL injection    Sig: Inject 30-50 Units under the skin daily.  . semaglutide (Ozempic) 1 mg/dose (4 mg/3 mL) subcutaneous pen injector    Sig: Inject 1 mg under the skin every 7 days.    Dispense:  9 mL    Refill:  1  . pen needle, diabetic  32 gauge x 5/32 ndle    Sig: Use as directed 4 times daily.    Dispense:  400 each    Refill:  3    Use 4 time daily. 32 g x 4 mm  . blood-glucose sensor (FreeStyle Libre 3 Plus Sensor)    Sig: 1 each by miscellaneous route every 14 (fourteen) days.    Dispense:  6 each    Refill:  3  . rosuvastatin  (CRESTOR ) 20 mg tablet    Sig:  Take 1 tablet (20 mg total) by mouth daily.    Dispense:  90 tablet    Refill:  3    Take 1 tablet (20 mg total) by mouth daily.     ASSESSMENT/PLAN: 1. Type 2 diabetes mellitus without complication, with long-term current use of insulin  (HCC) (Primary) I had a detailed discussion about further management of diabetes with the patient.  I have increased the dose of Ozempic to 1 mg once a week.  Continue with NovoLog  with meals.  Refill prescription.  Repeat labs. - POC Glucose (Hemocue/NOVA) - POC Hemoglobin A1c - semaglutide (Ozempic) 1 mg/dose (4 mg/3 mL) subcutaneous pen injector; Inject 1 mg under the skin every 7 days.  Dispense: 9 mL; Refill: 1 - blood-glucose sensor (FreeStyle Libre 3 Plus Sensor); 1 each by miscellaneous route every 14 (fourteen) days.  Dispense: 6 each; Refill: 3 - TSH; Future - T4, Free; Future - Comprehensive Metabolic Panel; Future - Albumin, Random Urine; Future  2. Essential hypertension Monitor urine microalbumin.  - Albumin, Random Urine; Future  3. Hyperlipidemia, mixed Monitor fasting lipid panel and adjust the dose of cholesterol regimen if necessary.  - Lipid Panel; Future    This Document was created using the aid of voice recognition Dragon dictation software.       [1]  Current Outpatient Medications:  .  albuterol  HFA (PROVENTIL  HFA;VENTOLIN  HFA;PROAIR  HFA) 90 mcg/actuation inhaler, Inhale 2 puffs every 6 (six) hours as needed for wheezing., Disp: 18 g, Rfl: 1 .  ammonium lactate (LAC-HYDRIN) 12 % lotion, Apply externally to affected area as directed for dry skin , Disp: , Rfl: 2 .  benzonatate   (TESSALON ) 200 mg capsule, Take 1 capsule (200 mg total) by mouth 2 (two) times a day as needed for cough., Disp: 30 capsule, Rfl: 0 .  blood-glucose sensor (FreeStyle Libre 3 Plus Sensor), 1 each by miscellaneous route every 14 (fourteen) days., Disp: 6 each, Rfl: 3 .  butalbital -acetaminophen -caffeine  (FIORICET ) 50-325-40 mg per tablet, Take 1 tablet by mouth., Disp: , Rfl:  .  ibuprofen  (MOTRIN ) 800 mg tablet, Take 1 tablet (800 mg total) by mouth every 8 (eight) hours as needed for moderate pain (4-6)., Disp: 90 tablet, Rfl: 0 .  insulin  aspart U-100 (NovoLOG  FlexPen) 100 unit/mL (3 mL) pen, Use as directed 3 times daily. Max daily dose is 50 units., Disp: 45 mL, Rfl: 1 .  insulin  glargine (LANTUS ) 100 unit/mL injection, Inject 30-50 Units under the skin daily., Disp: , Rfl:  .  labetaloL  (NORMODYNE ) 200 mg tablet, Take 400 mg by mouth 2 (two) times a day for 90 days., Disp: 360 tablet, Rfl: 3 .  medroxyPROGESTERone (Depo-Provera) 150 mg/mL syringe, Inject 1 mL (150 mg total) into the muscle every 3 (three) months., Disp: 1 mL, Rfl: 5 .  pen needle, diabetic 32 gauge x 5/32 ndle, Use as directed 4 times daily., Disp: 400 each, Rfl: 3 .  rosuvastatin  (CRESTOR ) 20 mg tablet, Take 1 tablet (20 mg total) by mouth daily., Disp: 90 tablet, Rfl: 3 .  semaglutide (Ozempic) 1 mg/dose (4 mg/3 mL) subcutaneous pen injector, Inject 1 mg under the skin every 7 days., Disp: 9 mL, Rfl: 1 [2] Allergies Allergen Reactions  . Latex Itching and Rash

## 2023-12-19 NOTE — Telephone Encounter (Signed)
 Patient is calling in regards to a missed call from the provider. Please advise  Call back [phone]

## 2023-12-23 NOTE — Telephone Encounter (Signed)
 Patient returning missed call, please advise   Phone: [phone]

## 2024-02-03 DIAGNOSIS — Z3042 Encounter for surveillance of injectable contraceptive: Secondary | ICD-10-CM | POA: Diagnosis not present

## 2024-02-03 NOTE — Progress Notes (Signed)
-------------------------------------------------------------------------------   Attestation signed by Cesar Blair Essex, DO at 02/03/2024 11:22 AM  I was present in office and immediately available to provide assistance if necessary during the time of service.  -------------------------------------------------------------------------------  Patient presents for Depo Medroxyprogesterone acetate. All questions were answered and she was instructed to NOT massage injection site. She was given information and instructions if she experiences problems with the injection. She is to let us  know if she experiences: severe or worsening headaches, depression, significant weight gain, concern about possible pregnancy, or heavy vaginal bleeding. The patient was given the appointment date to return to the office for her next injection. She was reminded to have back up method of contraception (such as condoms) available in case she is late for an injection.   Injection given in left deltoid.  RTO 04/26/24.

## 2024-03-16 DIAGNOSIS — E782 Mixed hyperlipidemia: Secondary | ICD-10-CM | POA: Diagnosis not present

## 2024-03-16 DIAGNOSIS — E119 Type 2 diabetes mellitus without complications: Secondary | ICD-10-CM | POA: Diagnosis not present

## 2024-03-16 DIAGNOSIS — Z794 Long term (current) use of insulin: Secondary | ICD-10-CM | POA: Diagnosis not present

## 2024-03-16 NOTE — Progress Notes (Signed)
 This Document was created using the aid of voice recognition Dragon dictation software.  HPI: Diabetes type 2: Patient's blood sugar control is improving.   Patient is currently on Ozempic.  Patient is also on Lantus .  Patient is taking NovoLog  as needed with meals. Patient is using Freestyle libre CGM. I have reviewed and interpreted the Freestyle libre CGM. The report is scanned in media section of the chart.   Patient blood sugars are ranging from 60s to 220s range.  Average blood sugars are 119.  96% the readings are within target range.  Patient has some hypoglycemia episodes with blood sugars dropping to 50s to 60s range.  No significant postprandial hyperglycemia is noted. Patient denies any nausea or vomiting.  Diet/Exercise: Patient is trying to follow diet.   Hyperlipidemia: LDL was elevated.  Dose of rosuvastatin  was increased last time.  The following portions of the patient's history were reviewed and updated as appropriate: allergies, current medications, past medical history, past social history and problem list.  Current Medications[1]  Allergies[2]  Patient Active Problem List   Diagnosis Date Noted   . Pain of right breast 01/11/2023  . Generalized abdominal pain 05/29/2022  . Mixed hyperlipidemia 10/12/2021  . Diabetic ketoacidosis without coma associated with type 2 diabetes mellitus (HCC) 07/24/2021  . Diabetic ketoacidosis (HCC) 07/07/2021  . Type 2 diabetes mellitus without complication, with long-term current use of insulin  (HCC) 07/06/2021  . Essential hypertension 07/06/2021  . Encounter for gynecological examination without abnormal finding 08/30/2020    Overview Note:     Pap with HPV up to date. Next pap due 2028 Depo for contraception    . Hypoglycemia 12/11/2019  . On Depo-Provera for contraception 12/04/2019    Overview Note:    Pleased with Depo Refilled.     . Other headache syndrome 11/12/2019  . Subcutaneous nodules 06/12/2016    Overview  Note:    Fingers of Lt hand most prominent.    SABRA Positive ANA (antinuclear antibody) 06/12/2016    Overview Note:    Dilution of 1:320 with Diffuse pattern.  Test obtained for evaluation of  subcutaneous skin nodule, skin changes (thickening), and extremity edema.   Referrals made to both Dermatology and Rheumatology.      Resolved Problems  No resolved problems to display.    ROS: Appropriate review of system was done and it was negative except for as mentioned in HPI.   VITALS: Vitals:   03/16/24 0922  BP: 110/77  Pulse: 100    LABS/RADIOLOGY/MEDICAL RECORDS:   HbA1c is 6.6 today which is better than last time Cholesterol was elevated when it was checked last time.  Renal function electrolytes are okay.    Recent Results (from the past 16 weeks)  Comprehensive Metabolic Panel   Collection Time: 12/03/23 11:27 AM  Result Value Ref Range   Sodium 137 136 - 145 mmol/L   Potassium 3.4 3.4 - 4.5 mmol/L   Chloride 104 98 - 107 mmol/L   CO2 23 21 - 31 mmol/L   Anion Gap 10 6 - 14 mmol/L   Glucose, Random 207 (H) 70 - 99 mg/dL   Blood Urea Nitrogen (BUN) 15 7 - 25 mg/dL   Creatinine 9.11 9.39 - 1.20 mg/dL   eGFR 87 >40 fO/fpw/8.26f7   Albumin 4.7 3.5 - 5.7 g/dL   Total Protein 8.0 6.4 - 8.9 g/dL   Bilirubin, Total 0.3 0.3 - 1.0 mg/dL   Alkaline Phosphatase (ALP) 57 34 - 104 U/L  Aspartate Aminotransferase (AST) 20 13 - 39 U/L   Alanine Aminotransferase (ALT) 41 7 - 52 U/L   Calcium  9.7 8.6 - 10.3 mg/dL   BUN/Creatinine Ratio    Urinalysis with Reflex to Microscopic   Collection Time: 12/03/23 11:27 AM  Result Value Ref Range   Color, Urine Yellow Yellow   Clarity, Urine Cloudy (A) Clear   Specific Gravity, Urine 1.036 (H) 1.005 - 1.025   pH, Urine 6.0 5.0 - 8.0   Protein, Urine 70 (A) Negative mg/dL   Glucose, Urine 50 (A) Negative mg/dL   Ketones, Urine Negative Negative mg/dL   Bilirubin, Urine Negative Negative   Blood, Urine 2+ (A) Negative   Nitrite,  Urine Negative Negative   Leukocyte Esterase, Urine Negative Negative, 25   Urobilinogen, Urine Normal <2.0 mg/dL   WBC, Urine None Seen <6 /HPF   RBC, Urine 6-10 (A) 0 - 2 /HPF   Bacteria, Urine None Seen None Seen, Rare /HPF   Squamous Epithelial Cells, Urine 6-10 (A) 0 - 5 /HPF  POC Hemoglobin A1c   Collection Time: 12/18/23  9:30 AM  Result Value Ref Range   Hemoglobin A1c (POC) 9.4 (A) 4.2 - 5.6 %   A1C Information      A1C Diagnostic Criteria: Prediabetes:5.7% - 6.4% Diabetes:>=6.5% A1C Glycemic Goals: Older Adults: <7.0% Children and Adolescents: <7.0% Pregnant Women: <6.0%   Kit/Device Lot # [id number]    Kit/Device Expiration Date 09/09/25   POC Glucose (Hemocue/NOVA)   Collection Time: 12/18/23  9:32 AM  Result Value Ref Range   Glucose, POC 255 (A) 70 - 125 mg/dL   Kit/Device Lot # [id number]    Kit/Device Expiration Date 02/24/25   Lipid Panel   Collection Time: 12/18/23  9:37 AM  Result Value Ref Range   Cholesterol, Total, Lipid Panel 239 (H) <200 mg/dL   Triglycerides, Lipid Panel 193 (H) <150 mg/dL   HDL Cholesterol - Lipid Panel 48 (L) >=60 mg/dL   LDL Cholesterol, Calculated 156 (H) <100 mg/dL   Non-HDL Cholesterol 808 mg/dL  TSH   Collection Time: 12/18/23  9:37 AM  Result Value Ref Range   TSH 1.082 0.450 - 5.330 uIU/mL  T4, Free   Collection Time: 12/18/23  9:37 AM  Result Value Ref Range   T4, Free 0.9 0.6 - 1.3 ng/dL  Comprehensive Metabolic Panel   Collection Time: 12/18/23  9:37 AM  Result Value Ref Range   Sodium 135 (L) 136 - 145 mmol/L   Potassium 3.8 3.5 - 5.1 mmol/L   Chloride 101 98 - 107 mmol/L   CO2 23 21 - 31 mmol/L   Anion Gap 11 6 - 14 mmol/L   Glucose, Random 267 (H) 70 - 99 mg/dL   Blood Urea Nitrogen (BUN) 26 (H) 7 - 25 mg/dL   Creatinine 8.98 9.39 - 1.20 mg/dL   eGFR 74 >40 fO/fpw/8.26f7   Albumin 4.9 3.5 - 5.7 g/dL   Total Protein 8.0 6.4 - 8.9 g/dL   Bilirubin, Total 0.3 0.3 - 1.0 mg/dL   Alkaline Phosphatase (ALP) 74 34 - 104  U/L   Aspartate Aminotransferase (AST) 14 13 - 39 U/L   Alanine Aminotransferase (ALT) 22 7 - 52 U/L   Calcium  9.7 8.6 - 10.3 mg/dL   BUN/Creatinine Ratio    Albumin, Random Urine   Collection Time: 12/18/23  9:37 AM  Result Value Ref Range   Albumin, Urine 434 Not Established mg/L   Creatinine, Urine 376 >=20 mg/dL  Albumin/Creatinine Ratio, Urine 115 (H) 0 - 30 mg/g creat  POC Glucose (Hemocue/NOVA)   Collection Time: 03/16/24  9:23 AM  Result Value Ref Range   Glucose, POC 165 (A) 70 - 125 mg/dL   Kit/Device Lot # [id number]    Kit/Device Expiration Date 11/12/25   POC Hemoglobin A1c   Collection Time: 03/16/24  9:23 AM  Result Value Ref Range   Hemoglobin A1c (POC) 6.6 (A) 4.2 - 5.6 %   A1C Information      A1C Diagnostic Criteria: Prediabetes:5.7% - 6.4% Diabetes:>=6.5% A1C Glycemic Goals: Older Adults: <7.0% Children and Adolescents: <7.0% Pregnant Women: <6.0%   Kit/Device Lot # [id number]    Kit/Device Expiration Date 10/21/25      ORDERS: Orders Placed This Encounter  Procedures  . Lipid Panel  . Comprehensive Metabolic Panel  . POC Glucose (Hemocue/NOVA)  . POC Hemoglobin A1c   Orders Placed This Encounter  Medications  . semaglutide (Ozempic) 1 mg/dose (4 mg/3 mL) subcutaneous pen injector    Sig: Inject 1 mg under the skin every 7 days.    Dispense:  9 mL    Refill:  1  . blood-glucose sensor (FreeStyle Libre 3 Plus Sensor)    Sig: 1 each by miscellaneous route every 14 (fourteen) days.    Dispense:  6 each    Refill:  3  . insulin  glargine (LANTUS  SoloStar) 100 unit/mL (3 mL) pen    Sig: Use 1 time daily as directed. Max daily dose is 50 units.    Dispense:  45 mL    Refill:  1  . pen needle, diabetic 32 gauge x 5/32 ndle    Sig: Use as directed 4 times daily.    Dispense:  400 each    Refill:  3    Use 4 time daily. 32 g x 4 mm  . insulin  aspart U-100 (NovoLOG  FlexPen) 100 unit/mL (3 mL) pen    Sig: Use as directed 3 times daily. Max daily dose is  50 units.     ASSESSMENT/PLAN: 1. Type 2 diabetes mellitus without complication, with long-term current use of insulin  (HCC) (Primary) I had a detailed discussion about further management of diabetes with the patient.  Continue with Ozempic.  Continue patient on current dose of Lantus .  Continue with NovoLog  as needed if the blood sugars are elevated.  Refill prescriptions.  I have filled out intermittent FMLA paperwork for hypoglycemia episodes. - POC Glucose (Hemocue/NOVA) - POC Hemoglobin A1c - semaglutide (Ozempic) 1 mg/dose (4 mg/3 mL) subcutaneous pen injector; Inject 1 mg under the skin every 7 days.  Dispense: 9 mL; Refill: 1 - blood-glucose sensor (FreeStyle Libre 3 Plus Sensor); 1 each by miscellaneous route every 14 (fourteen) days.  Dispense: 6 each; Refill: 3 - insulin  glargine (LANTUS  SoloStar) 100 unit/mL (3 mL) pen; Use 1 time daily as directed. Max daily dose is 50 units.  Dispense: 45 mL; Refill: 1  2. Mixed hyperlipidemia Monitor fasting lipid panel and adjust the dose of cholesterol regimen if necessary.  - Comprehensive Metabolic Panel; Future  3. Hyperlipidemia, mixed Monitor fasting lipid panel and adjust the dose of cholesterol regimen if necessary.  - Lipid Panel; Future    This Document was created using the aid of voice recognition Dragon dictation software.       [1]  Current Outpatient Medications:  .  albuterol  HFA (PROVENTIL  HFA;VENTOLIN  HFA;PROAIR  HFA) 90 mcg/actuation inhaler, Inhale 2 puffs every 6 (six) hours as needed for wheezing., Disp: 18  g, Rfl: 1 .  ammonium lactate (LAC-HYDRIN) 12 % lotion, Apply externally to affected area as directed for dry skin , Disp: , Rfl: 2 .  blood-glucose sensor (FreeStyle Libre 3 Plus Sensor), 1 each by miscellaneous route every 14 (fourteen) days., Disp: 6 each, Rfl: 3 .  butalbital -acetaminophen -caffeine  (FIORICET ) 50-325-40 mg per tablet, Take 1 tablet by mouth., Disp: , Rfl:  .  ibuprofen  (MOTRIN ) 800 mg  tablet, Take 1 tablet (800 mg total) by mouth every 8 (eight) hours as needed for moderate pain (4-6)., Disp: 90 tablet, Rfl: 0 .  insulin  aspart U-100 (NovoLOG  FlexPen) 100 unit/mL (3 mL) pen, Use as directed 3 times daily. Max daily dose is 50 units., Disp: , Rfl:  .  insulin  glargine (LANTUS  SoloStar) 100 unit/mL (3 mL) pen, Use 1 time daily as directed. Max daily dose is 50 units., Disp: 45 mL, Rfl: 1 .  labetaloL  (NORMODYNE ) 200 mg tablet, Take 400 mg by mouth 2 (two) times a day for 90 days., Disp: 360 tablet, Rfl: 3 .  medroxyPROGESTERone (Depo-Provera) 150 mg/mL syringe, Inject 1 mL (150 mg total) into the muscle every 3 (three) months., Disp: 1 mL, Rfl: 5 .  pen needle, diabetic 32 gauge x 5/32 ndle, Use as directed 4 times daily., Disp: 400 each, Rfl: 3 .  rosuvastatin  (CRESTOR ) 40 mg tablet, Take 1 tablet (40 mg total) by mouth daily., Disp: 90 tablet, Rfl: 1 .  semaglutide (Ozempic) 1 mg/dose (4 mg/3 mL) subcutaneous pen injector, Inject 1 mg under the skin every 7 days., Disp: 9 mL, Rfl: 1 [2] Allergies Allergen Reactions  . Latex Itching and Rash

## 2024-04-16 NOTE — Telephone Encounter (Signed)
 Copied from CRM [id number]. Topic: Medication/Rx - Rx Refill >> Apr 16, 2024  9:25 AM Patryce P wrote: Taylor Hood, Taylor Hood is calling for medication request (Obtain: Medication Name, Dosage, Quantity, Frequency, Quantity Requested (example: 30-day supply.)   Include all details related to the request(s) below: Patient is calling requesting a refill and is out of the medication.  Medication: ibuprofen  (MOTRIN ) 800 mg tablet   Please advise [id number] - [place], [place] - [address] AT [facility] - PHONE: [phone] - FAX: [phone]    Confirm and type the Best Contact Number below:  Patient/caller contact number:  [phone]           [] Home  [x] Mobile  [] Work [] Other   [x] Okay to leave a voicemail   Medication List:  Current Outpatient Medications:  .  albuterol  HFA (PROVENTIL  HFA;VENTOLIN  HFA;PROAIR  HFA) 90 mcg/actuation inhaler, Inhale 2 puffs every 6 (six) hours as needed for wheezing., Disp: 18 g, Rfl: 1 .  ammonium lactate (LAC-HYDRIN) 12 % lotion, Apply externally to affected area as directed for dry skin , Disp: , Rfl: 2 .  blood-glucose sensor (FreeStyle Libre 3 Plus Sensor), 1 each by miscellaneous route every 14 (fourteen) days., Disp: 6 each, Rfl: 3 .  butalbital -acetaminophen -caffeine  (FIORICET ) 50-325-40 mg per tablet, Take 1 tablet by mouth., Disp: , Rfl:  .  ibuprofen  (MOTRIN ) 800 mg tablet, Take 1 tablet (800 mg total) by mouth every 8 (eight) hours as needed for moderate pain (4-6)., Disp: 90 tablet, Rfl: 0 .  insulin  aspart U-100 (NovoLOG  FlexPen) 100 unit/mL (3 mL) pen, Use as directed 3 times daily. Max daily dose is 50 units., Disp: , Rfl:  .  insulin  glargine (LANTUS  SoloStar) 100 unit/mL (3 mL) pen, Use 1 time daily as directed. Max daily dose is 50 units., Disp: 45 mL, Rfl: 1 .  labetaloL  (NORMODYNE ) 200 mg tablet, Take 400 mg by mouth 2 (two) times a day for 90 days., Disp: 360 tablet, Rfl: 3 .  medroxyPROGESTERone  (Depo-Provera) 150 mg/mL syringe, Inject 1 mL (150 mg total) into the muscle every 3 (three) months., Disp: 1 mL, Rfl: 5 .  pen needle, diabetic 32 gauge x 5/32 ndle, Use as directed 4 times daily., Disp: 400 each, Rfl: 3 .  rosuvastatin  (CRESTOR ) 40 mg tablet, Take 1 tablet (40 mg total) by mouth daily., Disp: 90 tablet, Rfl: 1 .  semaglutide (Ozempic) 1 mg/dose (4 mg/3 mL) subcutaneous pen injector, Inject 1 mg under the skin every 7 days., Disp: 9 mL, Rfl: 1     Medication Request/Refills: Pharmacy Information (if applicable)   [] Not Applicable       [x]  Pharmacy listed  Send Medication Request to: [id number] - [place], [name] - [address] AT [facility] - PHONE: [phone] - FAX: [phone]                                                 [] Pharmacy not listed (added to pharmacy list in Epic) Send Medication Request to:      Listed Pharmacies: [id number] [place], [name] - [address] [facility] AT [facility] - PHONE: [phone] - FAX: [phone]

## 2024-04-16 NOTE — Telephone Encounter (Signed)
 Contacted patient re: ibuprofen  refill. Patient reports taking ibuprofen  for migraines and also some other joint problems. Patient due for an annual physical - scheduled for 12/26 at 11:20 am. Patient advised to get fasting labs done 2-3 days before appointment. [facility] Patient verbalizes understanding and confirmed appt.

## 2024-05-13 DIAGNOSIS — Z419 Encounter for procedure for purposes other than remedying health state, unspecified: Secondary | ICD-10-CM | POA: Diagnosis not present

## 2024-05-17 ENCOUNTER — Emergency Department (HOSPITAL_COMMUNITY)

## 2024-05-17 ENCOUNTER — Inpatient Hospital Stay (HOSPITAL_COMMUNITY)
Admission: EM | Admit: 2024-05-17 | Discharge: 2024-05-26 | DRG: 880 | Disposition: A | Discharge status: Home / Self Care | Attending: Family Medicine | Admitting: Family Medicine

## 2024-05-17 DIAGNOSIS — Z91040 Latex allergy status: Secondary | ICD-10-CM

## 2024-05-17 DIAGNOSIS — T71191A Asphyxiation due to mechanical threat to breathing due to other causes, accidental, initial encounter: Secondary | ICD-10-CM | POA: Diagnosis present

## 2024-05-17 DIAGNOSIS — Z833 Family history of diabetes mellitus: Secondary | ICD-10-CM | POA: Diagnosis not present

## 2024-05-17 DIAGNOSIS — G959 Disease of spinal cord, unspecified: Secondary | ICD-10-CM | POA: Diagnosis not present

## 2024-05-17 DIAGNOSIS — Z7985 Long-term (current) use of injectable non-insulin antidiabetic drugs: Secondary | ICD-10-CM | POA: Diagnosis not present

## 2024-05-17 DIAGNOSIS — R55 Syncope and collapse: Secondary | ICD-10-CM | POA: Diagnosis not present

## 2024-05-17 DIAGNOSIS — Z825 Family history of asthma and other chronic lower respiratory diseases: Secondary | ICD-10-CM

## 2024-05-17 DIAGNOSIS — Z8349 Family history of other endocrine, nutritional and metabolic diseases: Secondary | ICD-10-CM

## 2024-05-17 DIAGNOSIS — R4189 Other symptoms and signs involving cognitive functions and awareness: Secondary | ICD-10-CM | POA: Diagnosis not present

## 2024-05-17 DIAGNOSIS — R4182 Altered mental status, unspecified: Secondary | ICD-10-CM | POA: Diagnosis present

## 2024-05-17 DIAGNOSIS — M4802 Spinal stenosis, cervical region: Secondary | ICD-10-CM | POA: Diagnosis not present

## 2024-05-17 DIAGNOSIS — Z789 Other specified health status: Secondary | ICD-10-CM

## 2024-05-17 DIAGNOSIS — E119 Type 2 diabetes mellitus without complications: Secondary | ICD-10-CM | POA: Diagnosis present

## 2024-05-17 DIAGNOSIS — G40909 Epilepsy, unspecified, not intractable, without status epilepticus: Secondary | ICD-10-CM | POA: Diagnosis not present

## 2024-05-17 DIAGNOSIS — E876 Hypokalemia: Secondary | ICD-10-CM | POA: Diagnosis not present

## 2024-05-17 DIAGNOSIS — T71194A Asphyxiation due to mechanical threat to breathing due to other causes, undetermined, initial encounter: Secondary | ICD-10-CM

## 2024-05-17 DIAGNOSIS — F449 Dissociative and conversion disorder, unspecified: Principal | ICD-10-CM | POA: Diagnosis present

## 2024-05-17 DIAGNOSIS — Z79899 Other long term (current) drug therapy: Secondary | ICD-10-CM

## 2024-05-17 DIAGNOSIS — Z794 Long term (current) use of insulin: Secondary | ICD-10-CM

## 2024-05-17 DIAGNOSIS — E785 Hyperlipidemia, unspecified: Secondary | ICD-10-CM | POA: Diagnosis present

## 2024-05-17 DIAGNOSIS — R4701 Aphasia: Secondary | ICD-10-CM | POA: Diagnosis not present

## 2024-05-17 DIAGNOSIS — F4310 Post-traumatic stress disorder, unspecified: Secondary | ICD-10-CM | POA: Diagnosis present

## 2024-05-17 DIAGNOSIS — I10 Essential (primary) hypertension: Secondary | ICD-10-CM | POA: Diagnosis present

## 2024-05-17 DIAGNOSIS — Z87891 Personal history of nicotine dependence: Secondary | ICD-10-CM

## 2024-05-17 DIAGNOSIS — E118 Type 2 diabetes mellitus with unspecified complications: Secondary | ICD-10-CM | POA: Insufficient documentation

## 2024-05-17 DIAGNOSIS — R29818 Other symptoms and signs involving the nervous system: Secondary | ICD-10-CM | POA: Diagnosis not present

## 2024-05-17 DIAGNOSIS — E872 Acidosis, unspecified: Secondary | ICD-10-CM | POA: Diagnosis present

## 2024-05-17 DIAGNOSIS — R32 Unspecified urinary incontinence: Secondary | ICD-10-CM | POA: Diagnosis present

## 2024-05-17 DIAGNOSIS — Z8249 Family history of ischemic heart disease and other diseases of the circulatory system: Secondary | ICD-10-CM | POA: Diagnosis not present

## 2024-05-17 DIAGNOSIS — Y9385 Activity, choking game: Secondary | ICD-10-CM

## 2024-05-17 DIAGNOSIS — R7989 Other specified abnormal findings of blood chemistry: Secondary | ICD-10-CM

## 2024-05-17 DIAGNOSIS — Z8781 Personal history of (healed) traumatic fracture: Secondary | ICD-10-CM

## 2024-05-17 LAB — COMPREHENSIVE METABOLIC PANEL WITH GFR
ALT: 19 U/L (ref 0–44)
AST: 19 U/L (ref 15–41)
Albumin: 4 g/dL (ref 3.5–5.0)
Alkaline Phosphatase: 57 U/L (ref 38–126)
Anion gap: 11 (ref 5–15)
BUN: 13 mg/dL (ref 6–20)
CO2: 20 mmol/L — ABNORMAL LOW (ref 22–32)
Calcium: 8.7 mg/dL — ABNORMAL LOW (ref 8.9–10.3)
Chloride: 104 mmol/L (ref 98–111)
Creatinine, Ser: 0.87 mg/dL (ref 0.44–1.00)
GFR, Estimated: >60 mL/min (ref >60)
Glucose, Bld: 83 mg/dL (ref 70–99)
Potassium: 3.4 mmol/L — ABNORMAL LOW (ref 3.5–5.1)
Sodium: 135 mmol/L (ref 135–145)
Total Bilirubin: 0.7 mg/dL (ref 0.0–1.2)
Total Protein: 7.1 g/dL (ref 6.5–8.1)

## 2024-05-17 LAB — URINALYSIS, ROUTINE W REFLEX MICROSCOPIC
Bilirubin Urine: NEGATIVE
Glucose, UA: NEGATIVE mg/dL
Hgb urine dipstick: NEGATIVE
Ketones, ur: NEGATIVE mg/dL
Leukocytes,Ua: NEGATIVE
Nitrite: NEGATIVE
Protein, ur: NEGATIVE mg/dL
Specific Gravity, Urine: 1.04 — ABNORMAL HIGH (ref 1.005–1.030)
pH: 7 (ref 5.0–8.0)

## 2024-05-17 LAB — CBC WITH DIFFERENTIAL/PLATELET
Abs Immature Granulocytes: 0.04 K/uL (ref 0.00–0.07)
Basophils Absolute: 0.1 K/uL (ref 0.0–0.1)
Basophils Relative: 1 %
Eosinophils Absolute: 0.1 K/uL (ref 0.0–0.5)
Eosinophils Relative: 1 %
HCT: 35.1 % — ABNORMAL LOW (ref 36.0–46.0)
Hemoglobin: 11.9 g/dL — ABNORMAL LOW (ref 12.0–15.0)
Immature Granulocytes: 1 %
Lymphocytes Relative: 24 %
Lymphs Abs: 1.8 K/uL (ref 0.7–4.0)
MCH: 30.6 pg (ref 26.0–34.0)
MCHC: 33.9 g/dL (ref 30.0–36.0)
MCV: 90.2 fL (ref 80.0–100.0)
Monocytes Absolute: 0.6 K/uL (ref 0.1–1.0)
Monocytes Relative: 8 %
Neutro Abs: 5 K/uL (ref 1.7–7.7)
Neutrophils Relative %: 65 %
Platelets: 291 K/uL (ref 150–400)
RBC: 3.89 MIL/uL (ref 3.87–5.11)
RDW: 12.8 % (ref 11.5–15.5)
WBC: 7.5 K/uL (ref 4.0–10.5)
nRBC: 0 % (ref 0.0–0.2)

## 2024-05-17 LAB — I-STAT CHEM 8, ED
BUN: 18 mg/dL (ref 6–20)
Calcium, Ion: 1.03 mmol/L — ABNORMAL LOW (ref 1.15–1.40)
Chloride: 106 mmol/L (ref 98–111)
Creatinine, Ser: 1 mg/dL (ref 0.44–1.00)
Glucose, Bld: 85 mg/dL (ref 70–99)
HCT: 35 % — ABNORMAL LOW (ref 36.0–46.0)
Hemoglobin: 11.9 g/dL — ABNORMAL LOW (ref 12.0–15.0)
Potassium: 5.6 mmol/L — ABNORMAL HIGH (ref 3.5–5.1)
Sodium: 137 mmol/L (ref 135–145)
TCO2: 24 mmol/L (ref 22–32)

## 2024-05-17 LAB — RAPID URINE DRUG SCREEN, HOSP PERFORMED
Amphetamines: NOT DETECTED
Barbiturates: NOT DETECTED
Benzodiazepines: NOT DETECTED
Cocaine: NOT DETECTED
Opiates: NOT DETECTED
Tetrahydrocannabinol: NOT DETECTED

## 2024-05-17 LAB — CBG MONITORING, ED: Glucose-Capillary: 88 mg/dL (ref 70–99)

## 2024-05-17 LAB — ETHANOL: Alcohol, Ethyl (B): <15 mg/dL (ref <15)

## 2024-05-17 LAB — I-STAT CG4 LACTIC ACID, ED: Lactic Acid, Venous: 2.1 mmol/L (ref 0.5–1.9)

## 2024-05-17 LAB — PREGNANCY, URINE: Preg Test, Ur: NEGATIVE

## 2024-05-17 MED ORDER — NALOXONE HCL 2 MG/2ML IJ SOSY
PREFILLED_SYRINGE | INTRAMUSCULAR | Status: AC
  Administered 2024-05-17: 2 mg
  Filled 2024-05-17: qty 2

## 2024-05-17 MED ORDER — POTASSIUM CHLORIDE 10 MEQ/100ML IV SOLN
10.0000 meq | INTRAVENOUS | Status: AC
  Administered 2024-05-18 (×4): 10 meq via INTRAVENOUS
  Filled 2024-05-17 (×4): qty 100

## 2024-05-17 MED ORDER — ENOXAPARIN SODIUM 40 MG/0.4ML IJ SOSY
40.0000 mg | PREFILLED_SYRINGE | INTRAMUSCULAR | Status: DC
  Administered 2024-05-18 – 2024-05-26 (×9): 40 mg via SUBCUTANEOUS
  Filled 2024-05-17 (×9): qty 0.4

## 2024-05-17 MED ORDER — IOHEXOL 350 MG/ML SOLN
75.0000 mL | Freq: Once | INTRAVENOUS | Status: AC | PRN
  Administered 2024-05-17: 75 mL via INTRAVENOUS

## 2024-05-17 MED ORDER — THIAMINE HCL 100 MG/ML IJ SOLN
100.0000 mg | Freq: Once | INTRAMUSCULAR | Status: AC
  Administered 2024-05-17: 100 mg via INTRAVENOUS
  Filled 2024-05-17: qty 2

## 2024-05-17 MED ORDER — SODIUM CHLORIDE 0.9 % IV BOLUS
1000.0000 mL | Freq: Once | INTRAVENOUS | Status: AC
  Administered 2024-05-17: 1000 mL via INTRAVENOUS

## 2024-05-17 MED ORDER — SODIUM CHLORIDE 0.9% FLUSH
3.0000 mL | Freq: Two times a day (BID) | INTRAVENOUS | Status: DC
  Administered 2024-05-18 – 2024-05-26 (×16): 3 mL via INTRAVENOUS

## 2024-05-17 MED ORDER — INSULIN ASPART 100 UNIT/ML IJ SOLN: 0.0000 [IU] | INTRAMUSCULAR | Status: DC
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
## 2024-05-17 NOTE — Assessment & Plan Note (Signed)
 Low suspicion for infection. Normal creatinine and GFR. Bicarb slightly low, no anion gap. Glucose WNL. -recheck lactic -check CK, VBG

## 2024-05-17 NOTE — Assessment & Plan Note (Signed)
 3.4 on admission. No obvious EKG findings -IV potassium 10mEq x4
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
## 2024-05-17 NOTE — ED Notes (Signed)
 Pt transported to MRI
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
## 2024-05-17 NOTE — ED Notes (Signed)
 Pt placed in new gown and bedding changed, pt provided with new warm blankets.
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
## 2024-05-17 NOTE — Assessment & Plan Note (Addendum)
-  Admit to FMTS, progressive -Neuro checks, cardiac monitoring, continuous pulse ox -Appreciate neurology eval and recommendations -NPO, holding home meds for now -S/p 1L fluid bolus, consider additional fluids in AM if still unresponsive  -Has reportedly been incontinent of urine per nursing, ok to place external catheter for now but consider foley if she ends up requiring more fluids in order to monitor output. UA without evidence of infection.

## 2024-05-17 NOTE — Assessment & Plan Note (Signed)
 HTN: Reportedly on labetalol , possibly from ?gestational HTN. Holding for now T2DM: mSSI. Check A1c. Holding home lantus , unclear how often she is using this. HLD: holding home crestor  while NPO

## 2024-05-17 NOTE — ED Notes (Signed)
Neurology @ bedside.

## 2024-05-18 ENCOUNTER — Encounter (HOSPITAL_COMMUNITY): Payer: Self-pay | Admitting: Family Medicine

## 2024-05-18 ENCOUNTER — Other Ambulatory Visit: Payer: Self-pay

## 2024-05-18 ENCOUNTER — Inpatient Hospital Stay (HOSPITAL_COMMUNITY)

## 2024-05-18 DIAGNOSIS — R4189 Other symptoms and signs involving cognitive functions and awareness: Secondary | ICD-10-CM | POA: Diagnosis not present

## 2024-05-18 DIAGNOSIS — R4182 Altered mental status, unspecified: Secondary | ICD-10-CM

## 2024-05-18 DIAGNOSIS — R4701 Aphasia: Secondary | ICD-10-CM | POA: Diagnosis present

## 2024-05-18 LAB — BASIC METABOLIC PANEL WITH GFR
Anion gap: 11 (ref 5–15)
Anion gap: 12 (ref 5–15)
BUN: 11 mg/dL (ref 6–20)
BUN: 7 mg/dL (ref 6–20)
CO2: 24 mmol/L (ref 22–32)
CO2: 20 mmol/L — ABNORMAL LOW (ref 22–32)
Calcium: 8.9 mg/dL (ref 8.9–10.3)
Calcium: 8.9 mg/dL (ref 8.9–10.3)
Chloride: 103 mmol/L (ref 98–111)
Chloride: 105 mmol/L (ref 98–111)
Creatinine, Ser: 0.68 mg/dL (ref 0.44–1.00)
Creatinine, Ser: 0.8 mg/dL (ref 0.44–1.00)
GFR, Estimated: >60 mL/min (ref >60)
GFR, Estimated: >60 mL/min (ref >60)
Glucose, Bld: 101 mg/dL — ABNORMAL HIGH (ref 70–99)
Glucose, Bld: 83 mg/dL (ref 70–99)
Potassium: 3.1 mmol/L — ABNORMAL LOW (ref 3.5–5.1)
Potassium: 3.5 mmol/L (ref 3.5–5.1)
Sodium: 138 mmol/L (ref 135–145)
Sodium: 137 mmol/L (ref 135–145)

## 2024-05-18 LAB — GLUCOSE, CAPILLARY
Glucose-Capillary: 105 mg/dL — ABNORMAL HIGH (ref 70–99)
Glucose-Capillary: 111 mg/dL — ABNORMAL HIGH (ref 70–99)

## 2024-05-18 LAB — HEMOGLOBIN A1C
Hgb A1c MFr Bld: 6.1 % — ABNORMAL HIGH (ref 4.8–5.6)
Mean Plasma Glucose: 128.37 mg/dL

## 2024-05-18 LAB — CBC
HCT: 36.5 % (ref 36.0–46.0)
Hemoglobin: 12.2 g/dL (ref 12.0–15.0)
MCH: 30.3 pg (ref 26.0–34.0)
MCHC: 33.4 g/dL (ref 30.0–36.0)
MCV: 90.6 fL (ref 80.0–100.0)
Platelets: 283 K/uL (ref 150–400)
RBC: 4.03 MIL/uL (ref 3.87–5.11)
RDW: 12.9 % (ref 11.5–15.5)
WBC: 7.3 K/uL (ref 4.0–10.5)
nRBC: 0 % (ref 0.0–0.2)

## 2024-05-18 LAB — BLOOD GAS, VENOUS
Acid-Base Excess: 1.9 mmol/L (ref 0.0–2.0)
Bicarbonate: 26.6 mmol/L (ref 20.0–28.0)
O2 Saturation: 91.1 %
Patient temperature: 37
pCO2, Ven: 41 mmHg — ABNORMAL LOW (ref 44–60)
pH, Ven: 7.42 (ref 7.25–7.43)
pO2, Ven: 59 mmHg — ABNORMAL HIGH (ref 32–45)

## 2024-05-18 LAB — CBG MONITORING, ED
Glucose-Capillary: 92 mg/dL (ref 70–99)
Glucose-Capillary: 81 mg/dL (ref 70–99)
Glucose-Capillary: 91 mg/dL (ref 70–99)
Glucose-Capillary: 80 mg/dL (ref 70–99)
Glucose-Capillary: 87 mg/dL (ref 70–99)

## 2024-05-18 LAB — COOXEMETRY PANEL
Carboxyhemoglobin: 2.2 % — ABNORMAL HIGH (ref 0.5–1.5)
Methemoglobin: 1.7 % — ABNORMAL HIGH (ref 0.0–1.5)
O2 Saturation: 96 %
Total hemoglobin: 12.2 g/dL (ref 12.0–16.0)

## 2024-05-18 LAB — I-STAT CG4 LACTIC ACID, ED: Lactic Acid, Venous: 1 mmol/L (ref 0.5–1.9)

## 2024-05-18 LAB — TSH: TSH: 2.207 u[IU]/mL (ref 0.350–4.500)

## 2024-05-18 LAB — T4, FREE: Free T4: 0.68 ng/dL (ref 0.61–1.12)

## 2024-05-18 LAB — CK: Total CK: 150 U/L (ref 38–234)

## 2024-05-18 MED ORDER — KCL IN DEXTROSE-NACL 10-5-0.45 MEQ/L-%-% IV SOLN
INTRAVENOUS | Status: DC
  Filled 2024-05-18 (×2): qty 1000

## 2024-05-18 NOTE — ED Notes (Signed)
 NIH score using comatose score 3

## 2024-05-18 NOTE — Assessment & Plan Note (Addendum)
 3.4 on admission. No obvious EKG findings. 3.5 today and getting Kcl fluids.  -IV potassium 10mEq x4 repleted

## 2024-05-18 NOTE — Assessment & Plan Note (Addendum)
-  Neuro checks, cardiac monitoring, continuous pulse ox -Appreciate neurology eval and recommendations  - Awaiting EEG and results  -NPO, holding home meds for now - Maintenance fluids with 0.45% NaCl with KCl and D5 resuscitation  -Remains unresponsive however urinated this AM. Continue to monitor with q8h bladder scans. Foley if retains.  - TSH/T4 to r/o apathetic thyrotoxicosis; consider T3 if results significant for subclinical hypothyroidism - Psych evaluation when awake

## 2024-05-18 NOTE — ED Notes (Signed)
 NIH score using comatose scale -3 Family remains at bedside,

## 2024-05-18 NOTE — ED Notes (Signed)
 This RN was in pts room when pt opened eyes and took a gasp of air. Pt did move legs and head but would still not answer questions or respond to her name being called. Provider made aware.

## 2024-05-18 NOTE — Progress Notes (Signed)
 vLTM setup  All impedances below 10k. ED patient  [facility] not monitoring

## 2024-05-18 NOTE — Progress Notes (Signed)
 Daily Progress Note Intern Pager: [phone]  Patient name: Taylor Hood Medical record number: [id number] Date of birth: Jan 23, 1986 Age: 38 y.o. Gender: female  Primary Care Provider: Katrinka Hood LABOR, FNP Consultants: Neurology Code Status: Full  Pt Overview and Major Events to Date:  11/16: Admitted for unresponsiveness without clear etiology  Assessment and Plan:  Taylor Hood is a 38 y.o. female presenting with unresponsiveness with unclear etiology. Conflicting stories between family members. Moving on her own more today but still unresponsive to pain. Awaiting EEG today. Pertinent PMH/PSH includes DM, HLD, HTN.  Assessment & Plan Altered mental status -Neuro checks, cardiac monitoring, continuous pulse ox -Appreciate neurology eval and recommendations  - Awaiting EEG and results  -NPO, holding home meds for now - Maintenance fluids with 0.45% NaCl with KCl and D5 resuscitation  -Remains unresponsive however urinated this AM. Continue to monitor with q8h bladder scans. Foley if retains.  - TSH/T4 to r/o apathetic thyrotoxicosis; consider T3 if results significant for subclinical hypothyroidism - Psych evaluation when awake Elevated lactic acid level (Resolved: 05/18/2024) Normalized on recheck. -recheck lactic -check CK, VBG Hypokalemia (Resolved: 05/18/2024) 3.4 on admission. No obvious EKG findings. 3.5 today and getting Kcl fluids.  -IV potassium 10mEq x4 repleted  Chronic health problem HTN: Reportedly on labetalol , possibly from ?gestational HTN. Holding for now T2DM: mSSI. Check A1c. Holding home lantus , unclear how often she is using this. HLD: holding home crestor  while NPO  FEN/GI: NPO until wakes PPx: Lovenox  Dispo:Home pending clinical improvement .   Subjective:  Patient was seen and evaluated at bedside. No pain response. Mother and daughters at bedside.   No overnight events. VSS overnight.  Unable to follow commands, no pain response,  EOMI, PERRL, no nystagmus  Moves as if to make herself more comfortable in bed  Borderline tachycardic 99-102 while I'm in room examining and speaking to family  Awaiting 10am labs Awaiting EEG TSH/T4, T3 if shows subclinical thyroid  disease  Psych evaluation when awake   Objective: Temp:  [97 F (36.1 C)-97.8 F (36.6 C)] 97.8 F (36.6 C) (11/17 0651) Pulse Rate:  [81-100] 99 (11/17 0645) Resp:  [13-40] 16 (11/17 0645) BP: (119-170)/(81-128) 130/88 (11/17 0645) SpO2:  [93 %-100 %] 95 % (11/17 0645) Physical Exam: General: lying comfortably in hospital bed in no acute distress HEENT: MMM, non-icteric sclera, no eye contact, PERRL, EOMI Cardiovascular: tachycardic, no m/r/g, 2+ radial pulses Respiratory: CTAB, normal WOB Abdomen: soft, non-tender, non-distended Extremities: no peripheral edema Neuro: does not open eyes, does not follow commands or respond, no pain response, normal muscle tone  Laboratory: Most recent CBC Lab Results  Component Value Date   WBC 7.3 05/18/2024   HGB 12.2 05/18/2024   HCT 36.5 05/18/2024   MCV 90.6 05/18/2024   PLT 283 05/18/2024   Most recent BMP    Latest Ref Rng & Units 05/18/2024    1:22 AM  BMP  Glucose 70 - 99 mg/dL 898   BUN 6 - 20 mg/dL 11   Creatinine 9.55 - 1.00 mg/dL 9.31   Sodium 864 - 854 mmol/L 138   Potassium 3.5 - 5.1 mmol/L 3.1   Chloride 98 - 111 mmol/L 103   CO2 22 - 32 mmol/L 24   Calcium  8.9 - 10.3 mg/dL 8.9    Taylor Credit, DO 05/18/2024, 7:51 AM  PGY-1, [facility] FPTS Intern pager: [phone], text pages welcome Secure chat group [facility] Teaching Service

## 2024-05-18 NOTE — ED Notes (Signed)
 Hospital bed ordered.

## 2024-05-18 NOTE — Progress Notes (Signed)
-----------------------------------------------------------  [facility]--------------------------------------------------- --------------------------------------------------------D(Data) A(Action) R(response) Note------------------------------------------------  Patient Name: Taylor Hood Patient DOB: August 08, 1985 Date: @TODAY @      Data: Reviewed labs, VS, notes.    Action: No action at this time.      Response:       Sharolyn Batman, RN [facility]

## 2024-05-18 NOTE — Assessment & Plan Note (Signed)
 HTN: Reportedly on labetalol , possibly from ?gestational HTN. Holding for now T2DM: mSSI. Check A1c. Holding home lantus , unclear how often she is using this. HLD: holding home crestor  while NPO
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
## 2024-05-18 NOTE — ED Notes (Signed)
 No output measured in purewick or bedding, RN notified. Bladder scan 388 mL.

## 2024-05-18 NOTE — Assessment & Plan Note (Addendum)
 Normalized on recheck. -recheck lactic -check CK, VBG

## 2024-05-19 ENCOUNTER — Inpatient Hospital Stay (HOSPITAL_COMMUNITY)

## 2024-05-19 ENCOUNTER — Encounter: Payer: Self-pay | Admitting: Family Medicine

## 2024-05-19 DIAGNOSIS — F442 Dissociative stupor: Secondary | ICD-10-CM

## 2024-05-19 DIAGNOSIS — R569 Unspecified convulsions: Secondary | ICD-10-CM

## 2024-05-19 DIAGNOSIS — Z8659 Personal history of other mental and behavioral disorders: Secondary | ICD-10-CM | POA: Diagnosis not present

## 2024-05-19 DIAGNOSIS — R4701 Aphasia: Secondary | ICD-10-CM | POA: Diagnosis not present

## 2024-05-19 DIAGNOSIS — R4189 Other symptoms and signs involving cognitive functions and awareness: Secondary | ICD-10-CM | POA: Diagnosis not present

## 2024-05-19 DIAGNOSIS — F449 Dissociative and conversion disorder, unspecified: Principal | ICD-10-CM

## 2024-05-19 DIAGNOSIS — E118 Type 2 diabetes mellitus with unspecified complications: Secondary | ICD-10-CM

## 2024-05-19 LAB — GLUCOSE, CAPILLARY
Glucose-Capillary: 112 mg/dL — ABNORMAL HIGH (ref 70–99)
Glucose-Capillary: 102 mg/dL — ABNORMAL HIGH (ref 70–99)
Glucose-Capillary: 106 mg/dL — ABNORMAL HIGH (ref 70–99)
Glucose-Capillary: 87 mg/dL (ref 70–99)

## 2024-05-19 LAB — BASIC METABOLIC PANEL WITH GFR
Anion gap: 10 (ref 5–15)
BUN: 7 mg/dL (ref 6–20)
CO2: 22 mmol/L (ref 22–32)
Calcium: 8.6 mg/dL — ABNORMAL LOW (ref 8.9–10.3)
Chloride: 104 mmol/L (ref 98–111)
Creatinine, Ser: 0.72 mg/dL (ref 0.44–1.00)
GFR, Estimated: >60 mL/min (ref >60)
Glucose, Bld: 107 mg/dL — ABNORMAL HIGH (ref 70–99)
Potassium: 3.1 mmol/L — ABNORMAL LOW (ref 3.5–5.1)
Sodium: 136 mmol/L (ref 135–145)

## 2024-05-19 LAB — MISC LABCORP TEST (SEND OUT): Labcorp test code: 83935

## 2024-05-19 MED ORDER — POTASSIUM CHLORIDE 10 MEQ/100ML IV SOLN: 10.0000 meq | INTRAVENOUS | Status: DC

## 2024-05-19 MED ORDER — KCL IN DEXTROSE-NACL 40-5-0.9 MEQ/L-%-% IV SOLN
INTRAVENOUS | Status: AC
  Filled 2024-05-19 (×4): qty 1000

## 2024-05-19 MED ORDER — ACETAMINOPHEN 10 MG/ML IV SOLN
1000.0000 mg | Freq: Four times a day (QID) | INTRAVENOUS | Status: DC
  Administered 2024-05-19: 1000 mg via INTRAVENOUS
  Filled 2024-05-19 (×2): qty 100

## 2024-05-19 MED ORDER — LORAZEPAM 2 MG/ML IJ SOLN: 1.0000 mg | Freq: Three times a day (TID) | INTRAMUSCULAR | Status: DC | PRN

## 2024-05-19 MED ORDER — ACETAMINOPHEN 325 MG PO TABS
650.0000 mg | ORAL_TABLET | Freq: Four times a day (QID) | ORAL | Status: DC | PRN
  Administered 2024-05-20 – 2024-05-26 (×5): 650 mg via ORAL
  Filled 2024-05-19 (×5): qty 2
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
## 2024-05-19 NOTE — Progress Notes (Signed)
   05/18/24 2200 05/18/24 2354  External Urinary Catheter  Placement Date/Time: 05/17/24 2227   Person Placing LDA: Kaitlyn Shelton NT+3  External Urinary Catheter Type: Female External Catheter with Suction  Output (mL) 350 mL  --   Urine Measurement/Characteristics  Urinary Incontinence Yes  --   Urine Color Yellow/straw  --   Urine Appearance Clear  --   Urinary Interventions  --  Bladder scan  Bladder Scan Volume (mL)  --  0 mL   Pt also had large amount of unmeasured urine output leaking form PureWick x 1 at arrival to the unit. Bladder scanned found 0 ml of urine. Continue to monitor.  Wendi Dash, RN

## 2024-05-19 NOTE — Progress Notes (Signed)
 vLTM discontinued  No skin breakdown noted at all skin sites  [facility] notified

## 2024-05-19 NOTE — Progress Notes (Signed)
 Pt is transferred from ED to [facility] room 37. At arrival, she is somnolent, nonverbal. She only opens her eyes when calling her name, able to follow commands by squeezing her hands, but she is non responsive to painful stimuli and weakness of all 4 extremities. She is afebrile, stable hemodynamically, on room air, normal respiratory effort, NSR on the monitor. She is incontinent at urine. PureWick external urine catheter is applied. Continue EEG monitoring. Plan of care is reviewed. Pt is NPO, on D 5% and 0.45% NaCl with KVL 10 mEq/L at rate 100 ml/hr.   Pt's information is provided by Pt's sister and Pt's husband at bedside.     05/18/24 2041  Charting Type  Charting Type Admission/Transfer Assessment  Focused Reassessment No Changes Neurological;Psychosocial;HEENT;Respiratory;Cardiac;Vascular;Integumentary;Musculoskeletal;Gastrointestinal;Genitourinary  Neurological  Neuro (WDL) X  Orientation Level Other (comment) (UTA, nonverbal)  Cognition Follows commands;Memory impairment  Speech Mute  R Pupil Size (mm) 3  R Pupil Shape Round  R Pupil Reaction Brisk  L Pupil Size (mm) 3  L Pupil Shape Round  L Pupil Reaction Brisk  Motor Function/Sensation Assessment Grip;Motor strength;Sensation;Motor response  R Hand Grip Weak  L Hand Grip Weak   RUE Motor Response No movement to painful stimulus  RUE Sensation Decreased  RUE Motor Strength 2  LUE Motor Response No movement to painful stimulus  LUE Sensation Decreased  LUE Motor Strength 2  RLE Motor Response No movement to painful stimulus  RLE Sensation Decreased  RLE Motor Strength 1  LLE Motor Response No movement to painful stimulus  LLE Sensation Decreased  LLE Motor Strength 1  Neuro Symptoms Fatigue;Drowsiness  Neuro symptoms relieved by Relaxation techniques (Comment);Rest  Neuro Additional Assessments Glasgow Coma Scale  Glasgow Coma Scale  Eye Opening 3  Best Verbal Response (NON-intubated) 1  Best Motor Response 5   Glasgow Coma Scale Score 9  Neurological  Level of Consciousness Responds to Voice   We will continue to monitor.  Wendi Dash, RN
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
## 2024-05-19 NOTE — Progress Notes (Signed)
 Pt is more alert and able to change positions by turning herself left and right on the bed. She responds non-verbally with staff by nodding her head. She is able to rest well overnight, Vital signs remain stable, NSR on the monitor. Her husband and her little step daughter stay overnight at bedside. We will continue to monitor.   Wendi Dash, RN
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
## 2024-05-19 NOTE — Assessment & Plan Note (Addendum)
 HTN: Reportedly on labetalol , possibly from ?gestational HTN. Holding for now HLD: holding home crestor  while NPO
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
## 2024-05-19 NOTE — Assessment & Plan Note (Signed)
 CBGs normal since admission. NPO.  - Discontinue SSI for now - Continue to hold home insulin 

## 2024-05-19 NOTE — Progress Notes (Signed)
 Arrived to room for LTM maint. Pt having cath placed will try back another time for Maint.

## 2024-05-20 DIAGNOSIS — R4701 Aphasia: Secondary | ICD-10-CM | POA: Diagnosis not present

## 2024-05-20 DIAGNOSIS — F449 Dissociative and conversion disorder, unspecified: Secondary | ICD-10-CM | POA: Diagnosis not present

## 2024-05-20 DIAGNOSIS — F442 Dissociative stupor: Secondary | ICD-10-CM | POA: Diagnosis not present

## 2024-05-20 DIAGNOSIS — Z8659 Personal history of other mental and behavioral disorders: Secondary | ICD-10-CM | POA: Diagnosis not present

## 2024-05-20 DIAGNOSIS — E118 Type 2 diabetes mellitus with unspecified complications: Secondary | ICD-10-CM | POA: Diagnosis not present

## 2024-05-20 DIAGNOSIS — R4189 Other symptoms and signs involving cognitive functions and awareness: Secondary | ICD-10-CM | POA: Diagnosis not present

## 2024-05-20 LAB — BASIC METABOLIC PANEL WITH GFR
Anion gap: 12 (ref 5–15)
BUN: 9 mg/dL (ref 6–20)
CO2: 20 mmol/L — ABNORMAL LOW (ref 22–32)
Calcium: 8.9 mg/dL (ref 8.9–10.3)
Chloride: 109 mmol/L (ref 98–111)
Creatinine, Ser: 0.72 mg/dL (ref 0.44–1.00)
GFR, Estimated: >60 mL/min (ref >60)
Glucose, Bld: 105 mg/dL — ABNORMAL HIGH (ref 70–99)
Potassium: 3.9 mmol/L (ref 3.5–5.1)
Sodium: 141 mmol/L (ref 135–145)

## 2024-05-20 LAB — MAGNESIUM: Magnesium: 2 mg/dL (ref 1.7–2.4)

## 2024-05-20 MED ORDER — PHENOL 1.4 % MT LIQD
1.0000 | OROMUCOSAL | Status: DC | PRN
  Administered 2024-05-20: 1 via OROMUCOSAL
  Filled 2024-05-20: qty 177

## 2024-05-20 MED ORDER — LORAZEPAM 1 MG PO TABS
1.0000 mg | ORAL_TABLET | Freq: Two times a day (BID) | ORAL | Status: DC
  Administered 2024-05-20 (×2): 1 mg via ORAL
  Filled 2024-05-20 (×2): qty 1
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
## 2024-05-20 NOTE — Assessment & Plan Note (Addendum)
 CBGs normal since admission.  - Continue to hold home insulin  until CBG on full diet

## 2024-05-20 NOTE — Assessment & Plan Note (Signed)
 HTN: Reportedly on labetalol , possibly from ?gestational HTN. Holding for now HLD: holding home crestor  while NPO
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
## 2024-05-20 NOTE — Plan of Care (Signed)
 Neurology plan of care  Please see neurology progress note from yesterday. LTM EEG unremarkable. 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 No further neurologic workup recommended at this time. Neurology will not continue to actively follow. Feel free to reconsult if new neurologic concerns arise.  Elida Ross, MD [facility] [phone]  If 7pm- 7am, please page neurology on call as listed in AMION.
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
## 2024-05-20 NOTE — Progress Notes (Signed)
 ? ?  Inpatient Rehab Admissions Coordinator : ? ?Per therapy recommendations patient was screened for CIR candidacy by Ottie Glazier RN MSN. Patient does not appear to demonstrate the medical neccesity for a Hospital Rehabilitation /CIR admit. I will not place a Rehab Consult. Recommend other Rehab Venues to be pursued. Please contact me with any questions. ? ?Ottie Glazier RN MSN ?Admissions Coordinator ?[phone]  ?

## 2024-05-21 DIAGNOSIS — R4701 Aphasia: Secondary | ICD-10-CM | POA: Diagnosis not present

## 2024-05-21 DIAGNOSIS — R4189 Other symptoms and signs involving cognitive functions and awareness: Secondary | ICD-10-CM | POA: Diagnosis not present

## 2024-05-21 DIAGNOSIS — E118 Type 2 diabetes mellitus with unspecified complications: Secondary | ICD-10-CM | POA: Diagnosis not present

## 2024-05-21 DIAGNOSIS — Z8659 Personal history of other mental and behavioral disorders: Secondary | ICD-10-CM | POA: Diagnosis not present

## 2024-05-21 DIAGNOSIS — F449 Dissociative and conversion disorder, unspecified: Secondary | ICD-10-CM | POA: Diagnosis not present

## 2024-05-21 DIAGNOSIS — F442 Dissociative stupor: Secondary | ICD-10-CM | POA: Diagnosis not present

## 2024-05-21 LAB — BASIC METABOLIC PANEL WITH GFR
Anion gap: 11 (ref 5–15)
BUN: 11 mg/dL (ref 6–20)
CO2: 22 mmol/L (ref 22–32)
Calcium: 8.9 mg/dL (ref 8.9–10.3)
Chloride: 105 mmol/L (ref 98–111)
Creatinine, Ser: 0.83 mg/dL (ref 0.44–1.00)
GFR, Estimated: >60 mL/min (ref >60)
Glucose, Bld: 110 mg/dL — ABNORMAL HIGH (ref 70–99)
Potassium: 3.6 mmol/L (ref 3.5–5.1)
Sodium: 138 mmol/L (ref 135–145)

## 2024-05-21 MED ORDER — AMLODIPINE BESYLATE 5 MG PO TABS
5.0000 mg | ORAL_TABLET | Freq: Every day | ORAL | Status: DC
  Administered 2024-05-21 – 2024-05-26 (×6): 5 mg via ORAL
  Filled 2024-05-21 (×6): qty 1

## 2024-05-21 MED ORDER — LORAZEPAM 1 MG PO TABS
1.0000 mg | ORAL_TABLET | Freq: Two times a day (BID) | ORAL | Status: AC
  Administered 2024-05-21 – 2024-05-22 (×5): 1 mg via ORAL
  Filled 2024-05-21 (×6): qty 1

## 2024-05-21 NOTE — Assessment & Plan Note (Addendum)
 HLD: Continue home crestor 

## 2024-05-21 NOTE — Assessment & Plan Note (Addendum)
 CBGs normal since admission.  - Continue to hold home insulin 

## 2024-05-21 NOTE — Assessment & Plan Note (Addendum)
 Looking the best she has looked throughout entire hospitalization today. - Downgrade to MedSurg - Appreciate neurology eval and recommendations  - EEG unremarkable  - Neurology has signed off with no further neurologic workup recommended for conversion disorder - Psych evaluation, appreciate recommendations  - Scheduled lorazepam 1mg  oral increase to 3 times daily for the next 2 days then reevaluate  - Taper dose if patient too sedated - PT/OT eval and recommendations -Continue Chloraseptic spray for sore throat - Continue oral Tylenol  for pain management for head pain, body pain, encouraged to work well with PT OT
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
## 2024-05-21 NOTE — Assessment & Plan Note (Signed)
 Persistently elevated throughout hospital stay. - Begin amlodipine 5 mg
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
## 2024-05-22 DIAGNOSIS — I10 Essential (primary) hypertension: Secondary | ICD-10-CM | POA: Diagnosis not present

## 2024-05-22 DIAGNOSIS — R4189 Other symptoms and signs involving cognitive functions and awareness: Secondary | ICD-10-CM | POA: Diagnosis not present

## 2024-05-22 DIAGNOSIS — E118 Type 2 diabetes mellitus with unspecified complications: Secondary | ICD-10-CM | POA: Diagnosis not present

## 2024-05-22 DIAGNOSIS — F449 Dissociative and conversion disorder, unspecified: Secondary | ICD-10-CM | POA: Diagnosis not present

## 2024-05-22 DIAGNOSIS — R4701 Aphasia: Secondary | ICD-10-CM | POA: Diagnosis not present

## 2024-05-22 LAB — GLUCOSE, CAPILLARY
Glucose-Capillary: 96 mg/dL (ref 70–99)
Glucose-Capillary: 136 mg/dL — ABNORMAL HIGH (ref 70–99)

## 2024-05-22 NOTE — TOC Progression Note (Signed)
 Transition of Care [facility]) - Progression Note    Patient Details  Name: Taylor Hood MRN: [id number] Date of Birth: 11-27-1985  [facility] of [facility] CM/SW Contact  [name], [name] Phone Number: 05/22/2024, 11:07 AM  Clinical Narrative:   CSW spoke with [facility], they are asking for additional notes to review referral. Notes printed and faxed, awaiting response. CSW to follow.    Expected Discharge Plan: IP Rehab Facility Barriers to Discharge: Continued Medical Work up, English As A Second Language Teacher               Expected Discharge Plan and Services     Post Acute Care Choice: IP Rehab Living arrangements for the past 2 months: Single Family Home                                       Social Drivers of Health (SDOH) Interventions SDOH Screenings   Food Insecurity: Patient Unable To Answer (05/18/2024)  Housing: Unknown (05/18/2024)  Transportation Needs: Patient Unable To Answer (05/18/2024)  Utilities: Patient Unable To Answer (05/18/2024)  Physical Activity: Inactive (11/01/2019)   Received from [facility] visits prior to 09/01/2022.  Social Connections: Unknown (11/11/2021)   Received from [facility]  Tobacco Use: Medium Risk (05/18/2024)    Readmission Risk Interventions     No data to display
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
## 2024-05-22 NOTE — Progress Notes (Deleted)
 Pt is off to unit for dialysis.

## 2024-05-22 NOTE — Discharge Instructions (Signed)
 Dear Taylor Hood,  Thank you for letting us  participate in your care. You were hospitalized for ????????????? and diagnosed with Unresponsiveness. You were treated with ??????????.   POST-HOSPITAL & CARE INSTRUCTIONS ????????????? Go to your follow up appointments (listed below)  DOCTOR'S APPOINTMENT   No future appointments.   Take care and be well!  Family Medicine Teaching Service Inpatient Team [place]  [facility]  [address] [place], [name] [address] [phone]

## 2024-05-22 NOTE — Assessment & Plan Note (Addendum)
 Still elevated today however patient has only had 2 doses so far.              - Continue to monitor on amlodipine  5 mg
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
## 2024-05-22 NOTE — Plan of Care (Signed)

## 2024-05-22 NOTE — Assessment & Plan Note (Addendum)
 Continuing to improve, working well with OT during interview today - Psych evaluation, appreciate recommendations  - Scheduled lorazepam  1mg  oral increase to 3 times daily for the next 2 days then reevaluate  - Taper dose if patient too sedated  - 11/21: Patient has been cleared by psychiatry for discharge with a lorazepam  taper 3 times daily day 1, twice daily day 2, once day 3 - PT/OT eval and recommendations - Continue Chloraseptic spray for sore throat - Continue oral Tylenol  for pain management  - Appreciate neurology eval and recommendations  - EEG unremarkable  - Neurology has signed off with no further neurologic workup recommended for conversion disorder

## 2024-05-22 NOTE — Assessment & Plan Note (Addendum)
 CBGs QACHS - Consider restarting home insulin  if elevated CBGs

## 2024-05-22 NOTE — Assessment & Plan Note (Signed)
 HLD: Continue home crestor 
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
## 2024-05-23 DIAGNOSIS — R4189 Other symptoms and signs involving cognitive functions and awareness: Secondary | ICD-10-CM | POA: Diagnosis not present

## 2024-05-23 LAB — GLUCOSE, CAPILLARY
Glucose-Capillary: 102 mg/dL — ABNORMAL HIGH (ref 70–99)
Glucose-Capillary: 107 mg/dL — ABNORMAL HIGH (ref 70–99)

## 2024-05-23 LAB — CK: Total CK: 41 U/L (ref 38–234)

## 2024-05-23 MED ORDER — LORAZEPAM 1 MG PO TABS
1.0000 mg | ORAL_TABLET | Freq: Every day | ORAL | Status: AC
  Administered 2024-05-24: 1 mg via ORAL
  Filled 2024-05-23: qty 1

## 2024-05-23 MED ORDER — KETOROLAC TROMETHAMINE 15 MG/ML IJ SOLN
15.0000 mg | Freq: Once | INTRAMUSCULAR | Status: AC
  Administered 2024-05-23: 15 mg via INTRAVENOUS
  Filled 2024-05-23: qty 1

## 2024-05-23 MED ORDER — LORAZEPAM 1 MG PO TABS
1.0000 mg | ORAL_TABLET | Freq: Two times a day (BID) | ORAL | Status: AC
  Administered 2024-05-23 (×2): 1 mg via ORAL
  Filled 2024-05-23 (×2): qty 1

## 2024-05-23 NOTE — Plan of Care (Signed)

## 2024-05-23 NOTE — Assessment & Plan Note (Signed)
 HLD: Continue home crestor 
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
## 2024-05-23 NOTE — Assessment & Plan Note (Addendum)
 Psych and neurology have both signed off.  Psychiatry recs Ativan  taper as below.  No further neurological workup recommended for conversion disorder.  EEG unremarkable - Psych evaluation, appreciate recommendations - Scheduled lorazepam  1mg  oral increase to 3 times daily for the next 2 days then reevaluate  - Taper dose if patient too sedated  - 11/21: Patient has been cleared by psychiatry for discharge with a lorazepam  taper 3 times daily day 1, twice daily day 2, once day 3 - Appreciate neurology eval and recommendations - PT/OT eval and recommendations - Chloraseptic spray for sore throat - Pain regimen: tylenol  650g q6 PRN, Toradol  15mg  injection

## 2024-05-23 NOTE — Plan of Care (Signed)
  Problem: Health Behavior/Discharge Planning: Goal: Ability to manage health-related needs will improve Outcome: Progressing   Problem: Nutritional: Goal: Maintenance of adequate nutrition will improve Outcome: Progressing   Problem: Skin Integrity: Goal: Risk for impaired skin integrity will decrease Outcome: Progressing   Problem: Education: Goal: Knowledge of General Education information will improve Description: Including pain rating scale, medication(s)/side effects and non-pharmacologic comfort measures Outcome: Progressing   Problem: Health Behavior/Discharge Planning: Goal: Ability to manage health-related needs will improve Outcome: Progressing   Problem: Clinical Measurements: Goal: Will remain free from infection Outcome: Progressing   Problem: Activity: Goal: Risk for activity intolerance will decrease Outcome: Progressing   Problem: Coping: Goal: Level of anxiety will decrease Outcome: Progressing   Problem: Elimination: Goal: Will not experience complications related to bowel motility Outcome: Progressing   Problem: Safety: Goal: Ability to remain free from injury will improve Outcome: Progressing   Problem: Skin Integrity: Goal: Risk for impaired skin integrity will decrease Outcome: Progressing

## 2024-05-23 NOTE — Assessment & Plan Note (Signed)
-   Amlodipine  5 mg               -Continue to monitor

## 2024-05-23 NOTE — Assessment & Plan Note (Signed)
 CBGs QACHS - Consider restarting home insulin  if elevated CBGs
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
## 2024-05-24 DIAGNOSIS — R4189 Other symptoms and signs involving cognitive functions and awareness: Secondary | ICD-10-CM | POA: Diagnosis not present

## 2024-05-24 LAB — GLUCOSE, CAPILLARY
Glucose-Capillary: 100 mg/dL — ABNORMAL HIGH (ref 70–99)
Glucose-Capillary: 90 mg/dL (ref 70–99)
Glucose-Capillary: 121 mg/dL — ABNORMAL HIGH (ref 70–99)

## 2024-05-24 MED ORDER — GUAIFENESIN ER 600 MG PO TB12
600.0000 mg | ORAL_TABLET | Freq: Two times a day (BID) | ORAL | Status: DC | PRN
  Administered 2024-05-25 (×2): 600 mg via ORAL
  Filled 2024-05-24 (×2): qty 1

## 2024-05-24 MED ORDER — ALBUTEROL SULFATE (2.5 MG/3ML) 0.083% IN NEBU: 2.5000 mg | INHALATION_SOLUTION | Freq: Four times a day (QID) | RESPIRATORY_TRACT | Status: DC | PRN

## 2024-05-24 MED ORDER — KETOROLAC TROMETHAMINE 15 MG/ML IJ SOLN: 15.0000 mg | Freq: Once | INTRAMUSCULAR | Status: AC | PRN

## 2024-05-24 MED ORDER — ALBUTEROL SULFATE HFA 108 (90 BASE) MCG/ACT IN AERS: 1.0000 | INHALATION_SPRAY | Freq: Four times a day (QID) | RESPIRATORY_TRACT | Status: DC | PRN

## 2024-05-24 NOTE — Assessment & Plan Note (Signed)
 HLD: Continue home crestor 

## 2024-05-24 NOTE — Assessment & Plan Note (Signed)
 Glucose well-controlled off home insulin  --CBGs QACHS

## 2024-05-24 NOTE — Plan of Care (Signed)
  Problem: Health Behavior/Discharge Planning: Goal: Ability to manage health-related needs will improve Outcome: Progressing   Problem: Coping: Goal: Ability to adjust to condition or change in health will improve Outcome: Progressing   Problem: Skin Integrity: Goal: Risk for impaired skin integrity will decrease Outcome: Progressing   Problem: Tissue Perfusion: Goal: Adequacy of tissue perfusion will improve Outcome: Progressing   Problem: Clinical Measurements: Goal: Will remain free from infection Outcome: Progressing   Problem: Activity: Goal: Risk for activity intolerance will decrease Outcome: Progressing   Problem: Nutrition: Goal: Adequate nutrition will be maintained Outcome: Progressing   Problem: Coping: Goal: Level of anxiety will decrease Outcome: Progressing

## 2024-05-24 NOTE — Assessment & Plan Note (Addendum)
 Psychiatry and neurology have signed off.  Continuing Ativan  taper per psychiatry recommendations as below -- Day 3/4 of Ativan  taper:  -- Will receive 1 mg once today  -- Will plan to discontinue tomorrow 11/24 --Appreciate ongoing PT/OT eval and recommendations -- Chloraseptic spray for sore throat -- Pain regimen: tylenol  650g q6 PRN, Toradol  15mg  PRN daily x1

## 2024-05-24 NOTE — Assessment & Plan Note (Signed)
 Pressures stable -Continue amlodipine  5 mg               -Continue to monitor
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
## 2024-05-25 ENCOUNTER — Other Ambulatory Visit (HOSPITAL_COMMUNITY): Payer: Self-pay

## 2024-05-25 DIAGNOSIS — R4189 Other symptoms and signs involving cognitive functions and awareness: Secondary | ICD-10-CM | POA: Diagnosis not present

## 2024-05-25 LAB — GLUCOSE, CAPILLARY
Glucose-Capillary: 85 mg/dL (ref 70–99)
Glucose-Capillary: 100 mg/dL — ABNORMAL HIGH (ref 70–99)
Glucose-Capillary: 146 mg/dL — ABNORMAL HIGH (ref 70–99)
Glucose-Capillary: 104 mg/dL — ABNORMAL HIGH (ref 70–99)

## 2024-05-25 MED ORDER — AMLODIPINE BESYLATE 5 MG PO TABS
5.0000 mg | ORAL_TABLET | Freq: Every day | ORAL | 0 refills | Status: AC
  Filled 2024-05-25: qty 30, 30d supply, fill #0

## 2024-05-25 MED ORDER — ACETAMINOPHEN 325 MG PO TABS: 650.0000 mg | ORAL_TABLET | Freq: Four times a day (QID) | ORAL | Status: AC | PRN

## 2024-05-25 MED ORDER — MEDROXYPROGESTERONE ACETATE 150 MG/ML IM SUSP
150.0000 mg | Freq: Once | INTRAMUSCULAR | Status: AC
  Administered 2024-05-25: 150 mg via INTRAMUSCULAR
  Filled 2024-05-25: qty 1

## 2024-05-25 NOTE — Plan of Care (Signed)
  Problem: Health Behavior/Discharge Planning: Goal: Ability to manage health-related needs will improve Outcome: Progressing   Problem: Nutritional: Goal: Maintenance of adequate nutrition will improve Outcome: Progressing   Problem: Skin Integrity: Goal: Risk for impaired skin integrity will decrease Outcome: Progressing   Problem: Activity: Goal: Risk for activity intolerance will decrease Outcome: Progressing   Problem: Nutrition: Goal: Adequate nutrition will be maintained Outcome: Progressing   Problem: Pain Managment: Goal: General experience of comfort will improve and/or be controlled Outcome: Progressing   Problem: Safety: Goal: Ability to remain free from injury will improve Outcome: Progressing   Problem: Skin Integrity: Goal: Risk for impaired skin integrity will decrease Outcome: Progressing

## 2024-05-25 NOTE — Assessment & Plan Note (Addendum)
 Psychiatry and neurology have signed off.   -- Ativan  taper completed yesterday -- Appreciate ongoing PT/OT eval and recommendations  - Will monitor another day as PT does not feel patient is ready to go home   - Will begin DME requests for home health equipment to be ready by discharge -- Continue Chloraseptic spray for sore throat PRN -- Pain regimen: tylenol  650g q6 PRN, Toradol  15mg  PRN daily

## 2024-05-25 NOTE — Assessment & Plan Note (Addendum)
 Pressures stable however with intermittent higher diastolic pressures. - Discuss birth control  - Last depo in May or Aug 2025?  - Consider ACE/ARB if decides on [facility] due to child bearing age -Continue amlodipine  5 mg at this time

## 2024-05-25 NOTE — Assessment & Plan Note (Addendum)
 Glucose well-controlled off home insulin  -- Discontinue CBG checks
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
## 2024-05-25 NOTE — Assessment & Plan Note (Signed)
 HLD: Continue home crestor 

## 2024-05-25 NOTE — Plan of Care (Signed)
 Ambulating in halls with therapy.OOB to chair.    Problem: Education: Goal: Ability to describe self-care measures that may prevent or decrease complications (Diabetes Survival Skills Education) will improve Outcome: Progressing   Problem: Nutritional: Goal: Maintenance of adequate nutrition will improve Outcome: Progressing   Problem: Pain Managment: Goal: General experience of comfort will improve and/or be controlled Outcome: Progressing   Problem: Safety: Goal: Ability to remain free from injury will improve Outcome: Progressing   Problem: Skin Integrity: Goal: Risk for impaired skin integrity will decrease Outcome: Progressing

## 2024-05-25 NOTE — TOC Progression Note (Signed)
 Transition of Care [facility]) - Progression Note    Patient Details  Name: Taylor Hood MRN: [id number] Date of Birth: Aug 21, 1985  Transition of Care [facility]) CM/SW Contact  [name], [name] Phone Number: 05/25/2024, 10:20 AM  Clinical Narrative:   CSW spoke with Arlean at [facility], they also do not see any medical necessity for AIR, will not offer a bed. Medical team updated. PT/OT to eval again today, will follow for recommendations.    Expected Discharge Plan:  (TBD) Barriers to Discharge: Continued Medical Work up, Air Traffic Controller and Services     Post Acute Care Choice: IP Rehab Living arrangements for the past 2 months: Single Family Home                                       Social Drivers of Health (SDOH) Interventions SDOH Screenings   Food Insecurity: Patient Unable To Answer (05/18/2024)  Housing: Unknown (05/18/2024)  Transportation Needs: Patient Unable To Answer (05/18/2024)  Utilities: Patient Unable To Answer (05/18/2024)  Physical Activity: Inactive (11/01/2019)   Received from [facility] visits prior to 09/01/2022.  Social Connections: Unknown (11/11/2021)   Received from [facility]  Tobacco Use: Medium Risk (05/18/2024)    Readmission Risk Interventions     No data to display
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
## 2024-05-25 NOTE — TOC Progression Note (Addendum)
 Transition of Care [facility]) - Progression Note    Patient Details  Name: Taylor Hood MRN: [id number] Date of Birth: [birth year]  [facility] CM/SW Contact  Waddell Barnie Rama, RN Phone Number: 05/25/2024, 12:54 PM  Clinical Narrative:    NCM spoke with patient , since she is not candidate for CIR asked if she would like HH which she will probably just get two visits or would she like to do OP PT where she would get more visits,  she states she would like to do OP PT.  NCM sent referral to [facility] on [address] Per PT rec rolling walke, bsc, and w/chair.  Patient states she has no preference of agency.  [employer] will supply the DME.   Expected Discharge Plan:  (TBD) Barriers to Discharge: Continued Medical Work up, Air Traffic Controller and Services     Post Acute Care Choice: IP Rehab Living arrangements for the past 2 months: Single Family Home                                       Social Drivers of Health (SDOH) Interventions SDOH Screenings   Food Insecurity: Patient Unable To Answer (05/18/2024)  Housing: Unknown (05/18/2024)  Transportation Needs: Patient Unable To Answer (05/18/2024)  Utilities: Patient Unable To Answer (05/18/2024)  Physical Activity: Inactive (11/01/2019)   Received from [facility] visits prior to 09/01/2022.  Social Connections: Unknown (11/11/2021)   Received from [facility]  Tobacco Use: Medium Risk (05/18/2024)    Readmission Risk Interventions     No data to display
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
## 2024-05-26 DIAGNOSIS — R4189 Other symptoms and signs involving cognitive functions and awareness: Secondary | ICD-10-CM | POA: Diagnosis not present

## 2024-05-26 NOTE — Assessment & Plan Note (Deleted)
 HLD: Continue home crestor 

## 2024-05-26 NOTE — Assessment & Plan Note (Deleted)
 Psychiatry and neurology have signed off.   -- Ativan  taper completed yesterday -- Appreciate ongoing PT/OT eval and recommendations  - Will monitor another day as PT does not feel patient is ready to go home   - Will begin DME requests for home health equipment to be ready by discharge -- Continue Chloraseptic spray for sore throat PRN -- Pain regimen: tylenol  650g q6 PRN, Toradol  15mg  PRN daily

## 2024-05-26 NOTE — Assessment & Plan Note (Deleted)
 Glucose well-controlled off home insulin  -- Discontinue CBG checks

## 2024-05-26 NOTE — Plan of Care (Signed)
  Problem: Education: Goal: Individualized Educational Video(s) Outcome: Progressing   Problem: Coping: Goal: Ability to adjust to condition or change in health will improve Outcome: Progressing   Problem: Health Behavior/Discharge Planning: Goal: Ability to identify and utilize available resources and services will improve Outcome: Progressing
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
## 2024-05-26 NOTE — Assessment & Plan Note (Deleted)
 Pressures stable however with intermittent higher diastolic pressures. - Discuss birth control  - Last depo in May or Aug 2025?  - Consider ACE/ARB if decides on [facility] due to child bearing age -Continue amlodipine  5 mg at this time

## 2024-06-02 ENCOUNTER — Other Ambulatory Visit: Payer: Self-pay

## 2024-06-02 ENCOUNTER — Ambulatory Visit: Attending: Family Medicine | Admitting: Physical Therapy

## 2024-06-02 ENCOUNTER — Encounter: Payer: Self-pay | Admitting: Physical Therapy

## 2024-06-02 ENCOUNTER — Telehealth: Payer: Self-pay | Admitting: Physical Therapy

## 2024-06-02 VITALS — BP 154/81 | HR 95

## 2024-06-02 DIAGNOSIS — R278 Other lack of coordination: Secondary | ICD-10-CM | POA: Diagnosis present

## 2024-06-02 DIAGNOSIS — R2681 Unsteadiness on feet: Secondary | ICD-10-CM | POA: Diagnosis present

## 2024-06-02 DIAGNOSIS — R4189 Other symptoms and signs involving cognitive functions and awareness: Secondary | ICD-10-CM | POA: Insufficient documentation

## 2024-06-02 DIAGNOSIS — M6281 Muscle weakness (generalized): Secondary | ICD-10-CM | POA: Diagnosis present

## 2024-06-02 DIAGNOSIS — R2689 Other abnormalities of gait and mobility: Secondary | ICD-10-CM | POA: Insufficient documentation

## 2024-06-02 DIAGNOSIS — R29818 Other symptoms and signs involving the nervous system: Secondary | ICD-10-CM | POA: Insufficient documentation

## 2024-06-02 DIAGNOSIS — R29898 Other symptoms and signs involving the musculoskeletal system: Secondary | ICD-10-CM | POA: Insufficient documentation
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
## 2024-06-02 NOTE — Telephone Encounter (Signed)
 Dr. Rumball,  Hurman D Gosse  was evaluated by PT on 06/02/2024.  The patient would benefit from OT evaluation for LUE dexterity and functional deficits.   If you agree, please place an order in [facility] in [facility] or fax the order to [phone].  Thank you,  Daved Bull, PT, DPT  [facility] [address] [place], [name]  [address] Phone:  [phone] Fax:  [phone]

## 2024-06-04 NOTE — Addendum Note (Signed)
 Addended by: MADELON DONALD HERO on: 06/04/2024 08:40 AM   Modules accepted: Orders

## 2024-06-10 ENCOUNTER — Encounter: Payer: Self-pay | Admitting: Physical Therapy

## 2024-06-10 ENCOUNTER — Ambulatory Visit: Admitting: Physical Therapy

## 2024-06-10 DIAGNOSIS — R2689 Other abnormalities of gait and mobility: Secondary | ICD-10-CM | POA: Diagnosis not present

## 2024-06-10 DIAGNOSIS — M6281 Muscle weakness (generalized): Secondary | ICD-10-CM

## 2024-06-10 DIAGNOSIS — R29898 Other symptoms and signs involving the musculoskeletal system: Secondary | ICD-10-CM

## 2024-06-10 DIAGNOSIS — R2681 Unsteadiness on feet: Secondary | ICD-10-CM
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
## 2024-06-12 DIAGNOSIS — Z419 Encounter for procedure for purposes other than remedying health state, unspecified: Secondary | ICD-10-CM | POA: Diagnosis not present

## 2024-06-15 ENCOUNTER — Ambulatory Visit: Admitting: Occupational Therapy

## 2024-06-15 DIAGNOSIS — R29818 Other symptoms and signs involving the nervous system: Secondary | ICD-10-CM

## 2024-06-15 DIAGNOSIS — R2681 Unsteadiness on feet: Secondary | ICD-10-CM

## 2024-06-15 DIAGNOSIS — M6281 Muscle weakness (generalized): Secondary | ICD-10-CM

## 2024-06-15 DIAGNOSIS — R2689 Other abnormalities of gait and mobility: Secondary | ICD-10-CM | POA: Diagnosis not present

## 2024-06-15 DIAGNOSIS — R278 Other lack of coordination: Secondary | ICD-10-CM

## 2024-06-15 DIAGNOSIS — R29898 Other symptoms and signs involving the musculoskeletal system: Secondary | ICD-10-CM
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
## 2024-06-17 ENCOUNTER — Ambulatory Visit: Admitting: Physical Therapy

## 2024-06-17 ENCOUNTER — Encounter: Payer: Self-pay | Admitting: Physical Therapy

## 2024-06-17 DIAGNOSIS — R2681 Unsteadiness on feet: Secondary | ICD-10-CM

## 2024-06-17 DIAGNOSIS — R29818 Other symptoms and signs involving the nervous system: Secondary | ICD-10-CM

## 2024-06-17 DIAGNOSIS — R278 Other lack of coordination: Secondary | ICD-10-CM

## 2024-06-17 DIAGNOSIS — R2689 Other abnormalities of gait and mobility: Secondary | ICD-10-CM

## 2024-06-17 DIAGNOSIS — R29898 Other symptoms and signs involving the musculoskeletal system: Secondary | ICD-10-CM

## 2024-06-17 DIAGNOSIS — M6281 Muscle weakness (generalized): Secondary | ICD-10-CM

## 2024-06-17 NOTE — Patient Instructions (Signed)
 Access Code: [email or web address] URL: [email or web address] Date: 06/17/2024 Prepared by: Daved Bull  Exercises - Sit to Stand Without Arm Support  - 1 x daily - 4 x weekly - 1-2 sets - 5-8 reps - Standing March with Counter Support  - 1 x daily - 4 x weekly - 2 sets - 10 reps - Standing Tandem Balance with Counter Support  - 1 x daily - 4 x weekly - 1 sets - 2-3 reps - 30 seconds hold - Standing Near Stance in [place] with Eyes Closed  - 1 x daily - 4 x weekly - 1 sets - 2-3 reps - 30-60 seconds hold - Tandem Walking with Counter Support  - 1 x daily - 4 x weekly - 3 sets - 10 reps - Side Stepping with Resistance at Thighs and Counter Support  - 1 x daily - 4 x weekly - 3 sets - 10 reps
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
## 2024-06-22 ENCOUNTER — Ambulatory Visit: Admitting: Physical Therapy

## 2024-06-22 ENCOUNTER — Encounter: Payer: Self-pay | Admitting: Physical Therapy

## 2024-06-22 DIAGNOSIS — R29818 Other symptoms and signs involving the nervous system: Secondary | ICD-10-CM

## 2024-06-22 DIAGNOSIS — M6281 Muscle weakness (generalized): Secondary | ICD-10-CM

## 2024-06-22 DIAGNOSIS — R2689 Other abnormalities of gait and mobility: Secondary | ICD-10-CM | POA: Diagnosis not present

## 2024-06-22 DIAGNOSIS — R29898 Other symptoms and signs involving the musculoskeletal system: Secondary | ICD-10-CM

## 2024-06-22 DIAGNOSIS — R2681 Unsteadiness on feet: Secondary | ICD-10-CM

## 2024-06-22 DIAGNOSIS — R278 Other lack of coordination: Secondary | ICD-10-CM
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
## 2024-06-23 ENCOUNTER — Ambulatory Visit

## 2024-06-23 DIAGNOSIS — R278 Other lack of coordination: Secondary | ICD-10-CM

## 2024-06-23 DIAGNOSIS — R2689 Other abnormalities of gait and mobility: Secondary | ICD-10-CM | POA: Diagnosis not present

## 2024-06-23 DIAGNOSIS — R29898 Other symptoms and signs involving the musculoskeletal system: Secondary | ICD-10-CM

## 2024-06-23 DIAGNOSIS — M6281 Muscle weakness (generalized): Secondary | ICD-10-CM

## 2024-06-23 DIAGNOSIS — Z7689 Persons encountering health services in other specified circumstances: Secondary | ICD-10-CM | POA: Diagnosis not present
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
## 2024-06-23 NOTE — Patient Instructions (Addendum)

## 2024-06-29 ENCOUNTER — Encounter: Payer: Self-pay | Admitting: Physical Therapy

## 2024-06-29 ENCOUNTER — Ambulatory Visit (HOSPITAL_COMMUNITY): Payer: Self-pay

## 2024-06-29 ENCOUNTER — Ambulatory Visit

## 2024-06-29 ENCOUNTER — Ambulatory Visit: Admitting: Physical Therapy

## 2024-06-29 ENCOUNTER — Ambulatory Visit (HOSPITAL_COMMUNITY)
Admission: EM | Admit: 2024-06-29 | Discharge: 2024-06-29 | Disposition: A | Discharge status: Home / Self Care | Attending: Student | Admitting: Student

## 2024-06-29 ENCOUNTER — Ambulatory Visit (HOSPITAL_COMMUNITY)

## 2024-06-29 ENCOUNTER — Encounter (HOSPITAL_COMMUNITY): Payer: Self-pay | Admitting: Emergency Medicine

## 2024-06-29 VITALS — BP 131/105 | HR 106

## 2024-06-29 DIAGNOSIS — R2681 Unsteadiness on feet: Secondary | ICD-10-CM

## 2024-06-29 DIAGNOSIS — M25461 Effusion, right knee: Secondary | ICD-10-CM | POA: Diagnosis not present

## 2024-06-29 DIAGNOSIS — R29898 Other symptoms and signs involving the musculoskeletal system: Secondary | ICD-10-CM

## 2024-06-29 DIAGNOSIS — W19XXXA Unspecified fall, initial encounter: Secondary | ICD-10-CM

## 2024-06-29 DIAGNOSIS — R2689 Other abnormalities of gait and mobility: Secondary | ICD-10-CM | POA: Diagnosis not present

## 2024-06-29 DIAGNOSIS — S62633A Displaced fracture of distal phalanx of left middle finger, initial encounter for closed fracture: Secondary | ICD-10-CM

## 2024-06-29 DIAGNOSIS — R278 Other lack of coordination: Secondary | ICD-10-CM

## 2024-06-29 DIAGNOSIS — M6281 Muscle weakness (generalized): Secondary | ICD-10-CM

## 2024-06-29 DIAGNOSIS — M25561 Pain in right knee: Secondary | ICD-10-CM | POA: Diagnosis not present

## 2024-06-29 DIAGNOSIS — R29818 Other symptoms and signs involving the nervous system: Secondary | ICD-10-CM

## 2024-06-29 MED ORDER — BACLOFEN 5 MG PO TABS: 1.0000 | ORAL_TABLET | Freq: Three times a day (TID) | ORAL | 0 refills | Status: AC | PRN

## 2024-06-29 MED ORDER — IBUPROFEN 800 MG PO TABS: 800.0000 mg | ORAL_TABLET | Freq: Three times a day (TID) | ORAL | 0 refills | Status: AC

## 2024-06-29 NOTE — Discharge Instructions (Addendum)
-  You can take Tylenol  up to 1000 mg 3 times daily, and ibuprofen  up to 800 mg 3 times daily with food.  You can take these together, or alternate every 3-4 hours. -Rest, ice - Use the finger splint until you see the hand doctor. - I placed a referral to a hand specialist, if you do not hear from them within 1 week, call them using the number on the front of the paperwork. - You can take the muscle relaxer Baclofen  up to 3 times daily for muscle spasms and pain.  This medication will make you drowsy, so take it when you are home and do not need to drive.

## 2024-06-29 NOTE — Patient Instructions (Addendum)
 Joint Protection (Lifting)    Avoid picking up heavy items with one hand. Solution: Use both hands, and slide item whenever possible.  Copyright  VHI. All rights reserved.  Joint Protection (Grip)    Avoid: grasping thin utensils for prolonged periods. Solution: Hold thick-handled tools in dagger fashion when-ever possible for performing tasks such as stirring or scrub-bing. Relax fingers every 10 minutes during activity.  Copyright  VHI. All rights reserved.   Joint Protection Respect pain/Develop pain awareness Monitor activities - stop to rest when discomfort or fatigue develops Awareness of pain for activities prior to 12-24 hrs Pain lingering 2 hrs after activity is a warning sign - modify or eliminate Use larger joints and muscles Protect the delicate joints by using larger joints - use shoulder to carry bags not hands More proximal muscles and joints to perform ADLs (forearm/shoulder to open door-protect fingers) Groceries in paper bags Let the hip, elbow and arm support the grocery bag/push carts weight Push up from seat with palms of hands, not back of fingers Avoid tight/strong and prolonged grasp Build up handles (foam or cloth) on utensils, tools and pens Modify handles with levers Use AE for assistance for opening jars, pens and books, turning knobs and keys Change grasp to avoid static positioning Work with fingers extended over a large sponge rather than squeezing it Use open, flat palm to open jars, pump bottles for shampoo/soaps Avoid carrying, lifting and using heavy objects Use a cart to move heavy objects Push heavy objects with hips instead of pulling with hands Distribute weight over many joints - use 2 hands to lift an item Use light weight tools and objects Use smaller containers of liquid or distribute liquids to smaller containers to lighten loads Balance rest and activity Plan rest breaks ahead of time Avoid activities that cannot be stopped  if needed Energy conservation techniques (sitting when able, planning ahead, organizing workspace and storage for accessibility, keeping most common used items within easy access, resting during activities, use timesavers like prepared foods) AE-Labor saving devices (modified cooking writing or cutting utensils, zipper pulls, button hooks, built up handle items, large grip pens, jar openers, key holder/turner, garden tools, and spring loaded scissors) Use splints as prescribes to protect joints
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
## 2024-06-29 NOTE — ED Triage Notes (Signed)
 Pt reports fell down stairs last night and believe left 4th finger is broken as swollen and discolored. Patient c/o pain in legs and back and head also. Denies LOC. Put ice on finger.

## 2024-07-06 ENCOUNTER — Ambulatory Visit: Admitting: Occupational Therapy

## 2024-07-06 ENCOUNTER — Ambulatory Visit: Admitting: Physical Therapy

## 2024-07-13 ENCOUNTER — Ambulatory Visit

## 2024-07-13 ENCOUNTER — Ambulatory Visit: Attending: Family Medicine | Admitting: Physical Therapy

## 2024-07-13 ENCOUNTER — Encounter: Payer: Self-pay | Admitting: Physical Therapy

## 2024-07-13 VITALS — BP 150/96 | HR 81

## 2024-07-13 DIAGNOSIS — R2681 Unsteadiness on feet: Secondary | ICD-10-CM | POA: Diagnosis present

## 2024-07-13 DIAGNOSIS — R2689 Other abnormalities of gait and mobility: Secondary | ICD-10-CM | POA: Insufficient documentation

## 2024-07-13 DIAGNOSIS — M6281 Muscle weakness (generalized): Secondary | ICD-10-CM | POA: Insufficient documentation

## 2024-07-13 DIAGNOSIS — R29818 Other symptoms and signs involving the nervous system: Secondary | ICD-10-CM | POA: Insufficient documentation

## 2024-07-13 DIAGNOSIS — R278 Other lack of coordination: Secondary | ICD-10-CM | POA: Insufficient documentation

## 2024-07-13 DIAGNOSIS — R29898 Other symptoms and signs involving the musculoskeletal system: Secondary | ICD-10-CM | POA: Diagnosis present

## 2024-07-13 NOTE — Patient Instructions (Signed)
 SABRA
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
## 2024-07-20 ENCOUNTER — Ambulatory Visit: Admitting: Physical Therapy

## 2024-07-20 ENCOUNTER — Encounter: Payer: Self-pay | Admitting: Physical Therapy

## 2024-07-20 ENCOUNTER — Ambulatory Visit: Admitting: Occupational Therapy

## 2024-07-20 VITALS — BP 134/79 | HR 101

## 2024-07-20 DIAGNOSIS — M6281 Muscle weakness (generalized): Secondary | ICD-10-CM

## 2024-07-20 DIAGNOSIS — R29898 Other symptoms and signs involving the musculoskeletal system: Secondary | ICD-10-CM

## 2024-07-20 DIAGNOSIS — R29818 Other symptoms and signs involving the nervous system: Secondary | ICD-10-CM

## 2024-07-20 DIAGNOSIS — R2689 Other abnormalities of gait and mobility: Secondary | ICD-10-CM

## 2024-07-20 DIAGNOSIS — R278 Other lack of coordination: Secondary | ICD-10-CM

## 2024-07-20 DIAGNOSIS — R2681 Unsteadiness on feet: Secondary | ICD-10-CM

## 2024-07-20 NOTE — Patient Instructions (Signed)
 Access Code: [email or web address] URL: [email or web address] Date: 07/20/2024 Prepared by: Daved Bull  Exercises - Sit to Stand Without Arm Support  - 1 x daily - 4 x weekly - 1-2 sets - 5-8 reps - Standing March with Counter Support  - 1 x daily - 4 x weekly - 2 sets - 10 reps - Standing Tandem Balance with Counter Support  - 1 x daily - 4 x weekly - 1 sets - 2-3 reps - 30 seconds hold - Standing Near Stance in [place] with Eyes Closed  - 1 x daily - 4 x weekly - 1 sets - 2-3 reps - 30-60 seconds hold - Tandem Walking with Counter Support  - 1 x daily - 4 x weekly - 3 sets - 10 reps - Side Stepping with Resistance at Thighs and Counter Support  - 1 x daily - 4 x weekly - 3 sets - 10 reps - Forward and Backward Monster Walk with Counter Support  - 1 x daily - 4 x weekly - 3 sets - 10 reps - Walking Step Over  - 1 x daily - 4 x weekly - 3 sets - 10 reps - Side Step Overs with Cones and Counter Support  - 1 x daily - 4 x weekly - 3 sets - 10 reps - Backward Tandem Walking with Counter Support  - 1 x daily - 4 x weekly - 3 sets - 10 reps - Romberg Stance with Head Nods on Foam Pad  - 1 x daily - 4 x weekly - 3 sets - 12 reps - Romberg Stance on Foam Pad with Head Rotation  - 1 x daily - 7 x weekly - 3 sets - 12 reps
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
## 2024-07-20 NOTE — Patient Instructions (Addendum)
 Access Code: [email or web address] URL: [email or web address] Date: 07/20/2024 Prepared by: Clarita Pride  Exercises - Putty Squeezes  - 1 x daily - 10 reps - Rolling Putty on Table  - 1 x daily - 10 reps - Tip PUSH with Putty  - 1 x daily - 10 reps - 3-Point Pinch with Putty  - 1 x daily - 10 reps - Key Pinch with Putty  - 1 x daily - 10 reps  NEW: - Seated Finger PIP AROM  - 3 x daily - 10 reps - Seated Finger DIP AROM  - 3 x daily - 10 reps

## 2024-07-24 ENCOUNTER — Ambulatory Visit: Admitting: Diagnostic Neuroimaging

## 2024-07-24 ENCOUNTER — Encounter: Payer: Self-pay | Admitting: Diagnostic Neuroimaging

## 2024-07-24 VITALS — BP 126/80 | HR 111 | Ht 66.0 in | Wt 178.0 lb

## 2024-07-24 DIAGNOSIS — R4689 Other symptoms and signs involving appearance and behavior: Secondary | ICD-10-CM | POA: Diagnosis not present
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
## 2024-07-24 NOTE — Patient Instructions (Signed)
" °  ABNORMAL SPELL (following intercourse; prolonged altered state for days; then weakness lasting for weeks; now back to normal) - now improved; monitor for now with supportive care "

## 2024-07-31 ENCOUNTER — Ambulatory Visit

## 2024-07-31 ENCOUNTER — Encounter: Payer: Self-pay | Admitting: Physical Therapy

## 2024-07-31 ENCOUNTER — Ambulatory Visit: Admitting: Physical Therapy

## 2024-07-31 VITALS — BP 129/81 | HR 92

## 2024-07-31 DIAGNOSIS — M6281 Muscle weakness (generalized): Secondary | ICD-10-CM

## 2024-07-31 DIAGNOSIS — R29818 Other symptoms and signs involving the nervous system: Secondary | ICD-10-CM

## 2024-07-31 DIAGNOSIS — R2689 Other abnormalities of gait and mobility: Secondary | ICD-10-CM

## 2024-07-31 DIAGNOSIS — R278 Other lack of coordination: Secondary | ICD-10-CM

## 2024-07-31 DIAGNOSIS — R29898 Other symptoms and signs involving the musculoskeletal system: Secondary | ICD-10-CM

## 2024-07-31 DIAGNOSIS — R2681 Unsteadiness on feet: Secondary | ICD-10-CM

## 2024-07-31 NOTE — Patient Instructions (Signed)
 Taylor Hood
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
## 2024-08-04 ENCOUNTER — Encounter: Payer: Self-pay | Admitting: Physical Therapy

## 2024-08-04 ENCOUNTER — Ambulatory Visit: Admitting: Physical Therapy

## 2024-08-04 ENCOUNTER — Ambulatory Visit

## 2024-08-04 VITALS — BP 146/97 | HR 86

## 2024-08-04 DIAGNOSIS — R2689 Other abnormalities of gait and mobility: Secondary | ICD-10-CM

## 2024-08-04 DIAGNOSIS — M6281 Muscle weakness (generalized): Secondary | ICD-10-CM

## 2024-08-04 DIAGNOSIS — R29818 Other symptoms and signs involving the nervous system: Secondary | ICD-10-CM

## 2024-08-04 DIAGNOSIS — R278 Other lack of coordination: Secondary | ICD-10-CM

## 2024-08-04 DIAGNOSIS — R29898 Other symptoms and signs involving the musculoskeletal system: Secondary | ICD-10-CM

## 2024-08-04 DIAGNOSIS — R2681 Unsteadiness on feet: Secondary | ICD-10-CM

## 2024-08-04 NOTE — Patient Instructions (Signed)
 How to Use Cold Packs or Ice Cold therapy uses cold temperatures to treat an injury or other problem. You can use cold packs or ice packs to help with pain and swelling. Only use cold therapy if your doctor says it's OK. What are the risks? Your doctor will talk with you about risks. You may be told to avoid cold therapy if: You're not able to let people know when you're in pain. Young children and people who have a brain problem called dementia may have trouble with this. You have certain conditions, such as: Raynaud's syndrome. This is a problem with your blood vessels. It slows blood flow to your fingers and toes. Feeling very cold easily. Lack of feeling in the area being iced. Do not use cold therapy unless your doctor says it's OK if you have: A heart condition. High blood pressure. Open or healing wounds. An infection. Rheumatoid arthritis. This is pain and swelling in your joints. Poor blood flow. Diabetes. Certain skin problems. How do I make a cold pack? There are a few things you can use to make a cold pack at home. These include: A silica gel cold pack that's been left in the freezer. You can buy this online or in stores. A sealable plastic bag filled with crushed ice. A washcloth or paper towels soaked in cold water or ice water. A plastic bag of frozen vegetables. Throw the bag away when you're done using it as a cold pack. Supplies needed: A cold pack. A towel. This can be dry or damp. How to use cold therapy  Have your cold pack ready. Place a towel between the cold pack and your skin. Or, you can wrap the cold pack in a towel. Use the cold pack. Leave it on for no more than 20 minutes at a time. Check your skin after 5 minutes for any problems. Check for: White spots on your skin. Your skin may look blotchy or mottled. Skin that looks blue or pale. Skin that feels waxy or hard. Do these steps as many times each day as told. If your skin turns red, take off the  cold pack right away to prevent skin damage. The risk of damage is higher if you can't feel pain, heat, or cold. Be sure to always use a towel. Do not put the cold pack right on your skin. Contact a doctor if: You start to have white spots on your skin. Your skin turns blue or pale. Your skin gets waxy or hard. Your swelling gets worse. This information is not intended to replace advice given to you by your health care provider. Make sure you discuss any questions you have with your health care provider. Document Revised: 04/28/2024 Document Reviewed: 01/23/2023 Elsevier Patient Education  2025 [employer].

## 2024-08-11 ENCOUNTER — Ambulatory Visit: Admitting: Physical Therapy

## 2024-08-11 ENCOUNTER — Ambulatory Visit: Admitting: Occupational Therapy

## 2024-08-19 ENCOUNTER — Ambulatory Visit: Admitting: Occupational Therapy

## 2024-08-19 ENCOUNTER — Ambulatory Visit: Admitting: Physical Therapy

## 2024-08-26 ENCOUNTER — Ambulatory Visit: Admitting: Occupational Therapy

## 2024-08-26 ENCOUNTER — Ambulatory Visit: Admitting: Physical Therapy
